# Patient Record
Sex: Female | Born: 1955
Health system: Southern US, Community
[De-identification: ages and names within clinical notes are randomized; demographics above are authoritative.]

## PROBLEM LIST (undated history)

## (undated) DIAGNOSIS — T7840XA Allergy, unspecified, initial encounter: Secondary | ICD-10-CM

## (undated) DIAGNOSIS — K635 Polyp of colon: Secondary | ICD-10-CM

## (undated) DIAGNOSIS — I10 Essential (primary) hypertension: Secondary | ICD-10-CM

## (undated) DIAGNOSIS — H269 Unspecified cataract: Secondary | ICD-10-CM

## (undated) HISTORY — PX: URETHRAL CYST REMOVAL: SHX5128

## (undated) HISTORY — DX: Essential (primary) hypertension: I10

## (undated) HISTORY — PX: COLONOSCOPY W/ POLYPECTOMY: SHX1380

## (undated) HISTORY — PX: WISDOM TOOTH EXTRACTION: SHX21

## (undated) HISTORY — DX: Unspecified cataract: H26.9

## (undated) HISTORY — DX: Allergy, unspecified, initial encounter: T78.40XA

## (undated) HISTORY — PX: TONSILLECTOMY: SUR1361

## (undated) HISTORY — DX: Polyp of colon: K63.5

---

## 1979-11-08 HISTORY — PX: TUBAL LIGATION: SHX77

## 1998-01-23 ENCOUNTER — Ambulatory Visit (HOSPITAL_COMMUNITY): Admission: RE | Admit: 1998-01-23 | Discharge: 1998-01-23 | Payer: Self-pay | Admitting: Emergency Medicine

## 1999-01-06 ENCOUNTER — Other Ambulatory Visit: Admission: RE | Admit: 1999-01-06 | Discharge: 1999-01-06 | Payer: Self-pay | Admitting: Emergency Medicine

## 1999-02-11 ENCOUNTER — Encounter: Payer: Self-pay | Admitting: Emergency Medicine

## 1999-02-11 ENCOUNTER — Ambulatory Visit (HOSPITAL_COMMUNITY): Admission: RE | Admit: 1999-02-11 | Discharge: 1999-02-11 | Payer: Self-pay | Admitting: Emergency Medicine

## 2000-01-21 ENCOUNTER — Other Ambulatory Visit: Admission: RE | Admit: 2000-01-21 | Discharge: 2000-01-21 | Payer: Self-pay | Admitting: Emergency Medicine

## 2000-01-28 ENCOUNTER — Encounter: Admission: RE | Admit: 2000-01-28 | Discharge: 2000-04-27 | Payer: Self-pay | Admitting: Emergency Medicine

## 2000-02-15 ENCOUNTER — Ambulatory Visit (HOSPITAL_COMMUNITY): Admission: RE | Admit: 2000-02-15 | Discharge: 2000-02-15 | Payer: Self-pay | Admitting: *Deleted

## 2000-08-09 ENCOUNTER — Encounter: Admission: RE | Admit: 2000-08-09 | Discharge: 2000-11-07 | Payer: Self-pay | Admitting: Emergency Medicine

## 2001-03-29 ENCOUNTER — Encounter: Payer: Self-pay | Admitting: Emergency Medicine

## 2001-03-29 ENCOUNTER — Ambulatory Visit (HOSPITAL_COMMUNITY): Admission: RE | Admit: 2001-03-29 | Discharge: 2001-03-29 | Payer: Self-pay | Admitting: Emergency Medicine

## 2001-04-04 ENCOUNTER — Encounter: Payer: Self-pay | Admitting: Emergency Medicine

## 2001-04-04 ENCOUNTER — Encounter: Admission: RE | Admit: 2001-04-04 | Discharge: 2001-04-04 | Payer: Self-pay | Admitting: Emergency Medicine

## 2001-04-19 ENCOUNTER — Other Ambulatory Visit: Admission: RE | Admit: 2001-04-19 | Discharge: 2001-04-19 | Payer: Self-pay | Admitting: Emergency Medicine

## 2002-04-09 ENCOUNTER — Encounter: Payer: Self-pay | Admitting: Emergency Medicine

## 2002-04-09 ENCOUNTER — Encounter: Admission: RE | Admit: 2002-04-09 | Discharge: 2002-04-09 | Payer: Self-pay | Admitting: Emergency Medicine

## 2002-04-17 ENCOUNTER — Ambulatory Visit (HOSPITAL_COMMUNITY): Admission: RE | Admit: 2002-04-17 | Discharge: 2002-04-17 | Payer: Self-pay | Admitting: Emergency Medicine

## 2002-04-17 ENCOUNTER — Encounter: Payer: Self-pay | Admitting: Emergency Medicine

## 2002-04-30 ENCOUNTER — Encounter: Admission: RE | Admit: 2002-04-30 | Discharge: 2002-04-30 | Payer: Self-pay | Admitting: Emergency Medicine

## 2002-04-30 ENCOUNTER — Encounter: Payer: Self-pay | Admitting: Emergency Medicine

## 2002-09-24 ENCOUNTER — Encounter: Admission: RE | Admit: 2002-09-24 | Discharge: 2002-09-24 | Payer: Self-pay | Admitting: Emergency Medicine

## 2002-09-24 ENCOUNTER — Encounter: Payer: Self-pay | Admitting: Emergency Medicine

## 2002-11-10 ENCOUNTER — Ambulatory Visit (HOSPITAL_COMMUNITY): Admission: RE | Admit: 2002-11-10 | Discharge: 2002-11-10 | Payer: Self-pay | Admitting: Emergency Medicine

## 2002-11-10 ENCOUNTER — Encounter: Payer: Self-pay | Admitting: Emergency Medicine

## 2003-05-08 ENCOUNTER — Ambulatory Visit (HOSPITAL_COMMUNITY): Admission: RE | Admit: 2003-05-08 | Discharge: 2003-05-08 | Payer: Self-pay | Admitting: Emergency Medicine

## 2003-05-08 ENCOUNTER — Encounter: Payer: Self-pay | Admitting: Emergency Medicine

## 2003-08-21 ENCOUNTER — Encounter: Admission: RE | Admit: 2003-08-21 | Discharge: 2003-11-19 | Payer: Self-pay | Admitting: Emergency Medicine

## 2004-06-04 ENCOUNTER — Ambulatory Visit (HOSPITAL_COMMUNITY): Admission: RE | Admit: 2004-06-04 | Discharge: 2004-06-04 | Payer: Self-pay | Admitting: Emergency Medicine

## 2005-05-31 ENCOUNTER — Ambulatory Visit: Payer: Self-pay | Admitting: Internal Medicine

## 2005-06-07 ENCOUNTER — Ambulatory Visit (HOSPITAL_COMMUNITY): Admission: RE | Admit: 2005-06-07 | Discharge: 2005-06-07 | Payer: Self-pay | Admitting: Emergency Medicine

## 2005-07-19 ENCOUNTER — Encounter: Admission: RE | Admit: 2005-07-19 | Discharge: 2005-07-19 | Payer: Self-pay | Admitting: Emergency Medicine

## 2005-10-14 ENCOUNTER — Ambulatory Visit: Payer: Self-pay | Admitting: Gastroenterology

## 2005-10-25 ENCOUNTER — Ambulatory Visit: Payer: Self-pay | Admitting: Gastroenterology

## 2005-10-25 ENCOUNTER — Encounter (INDEPENDENT_AMBULATORY_CARE_PROVIDER_SITE_OTHER): Payer: Self-pay | Admitting: *Deleted

## 2006-07-06 ENCOUNTER — Ambulatory Visit (HOSPITAL_COMMUNITY): Admission: RE | Admit: 2006-07-06 | Discharge: 2006-07-06 | Payer: Self-pay | Admitting: Emergency Medicine

## 2007-07-10 ENCOUNTER — Ambulatory Visit (HOSPITAL_COMMUNITY): Admission: RE | Admit: 2007-07-10 | Discharge: 2007-07-10 | Payer: Self-pay | Admitting: Emergency Medicine

## 2008-07-11 ENCOUNTER — Ambulatory Visit (HOSPITAL_COMMUNITY): Admission: RE | Admit: 2008-07-11 | Discharge: 2008-07-11 | Payer: Self-pay | Admitting: Emergency Medicine

## 2008-07-31 ENCOUNTER — Ambulatory Visit: Payer: Self-pay | Admitting: Gastroenterology

## 2008-08-15 ENCOUNTER — Encounter: Payer: Self-pay | Admitting: Gastroenterology

## 2008-08-15 ENCOUNTER — Ambulatory Visit: Payer: Self-pay | Admitting: Gastroenterology

## 2008-08-19 ENCOUNTER — Encounter: Payer: Self-pay | Admitting: Gastroenterology

## 2009-01-14 ENCOUNTER — Encounter (INDEPENDENT_AMBULATORY_CARE_PROVIDER_SITE_OTHER): Payer: Self-pay | Admitting: *Deleted

## 2009-07-17 ENCOUNTER — Ambulatory Visit (HOSPITAL_COMMUNITY): Admission: RE | Admit: 2009-07-17 | Discharge: 2009-07-17 | Payer: Self-pay | Admitting: Family Medicine

## 2009-10-05 ENCOUNTER — Encounter (INDEPENDENT_AMBULATORY_CARE_PROVIDER_SITE_OTHER): Payer: Self-pay | Admitting: *Deleted

## 2009-10-06 ENCOUNTER — Ambulatory Visit: Payer: Self-pay | Admitting: Gastroenterology

## 2009-10-16 ENCOUNTER — Ambulatory Visit: Payer: Self-pay | Admitting: Gastroenterology

## 2009-10-21 ENCOUNTER — Encounter: Payer: Self-pay | Admitting: Gastroenterology

## 2010-06-16 ENCOUNTER — Other Ambulatory Visit: Admission: RE | Admit: 2010-06-16 | Discharge: 2010-06-16 | Payer: Self-pay | Admitting: Family Medicine

## 2010-08-06 ENCOUNTER — Ambulatory Visit (HOSPITAL_COMMUNITY): Admission: RE | Admit: 2010-08-06 | Discharge: 2010-08-06 | Payer: Self-pay | Admitting: Family Medicine

## 2010-08-18 ENCOUNTER — Encounter: Admission: RE | Admit: 2010-08-18 | Discharge: 2010-08-18 | Payer: Self-pay | Admitting: Family Medicine

## 2010-11-28 ENCOUNTER — Encounter: Payer: Self-pay | Admitting: Emergency Medicine

## 2011-07-21 ENCOUNTER — Other Ambulatory Visit (HOSPITAL_COMMUNITY): Payer: Self-pay | Admitting: Family Medicine

## 2011-07-21 DIAGNOSIS — Z1231 Encounter for screening mammogram for malignant neoplasm of breast: Secondary | ICD-10-CM

## 2011-08-02 ENCOUNTER — Ambulatory Visit (HOSPITAL_COMMUNITY): Payer: 59 | Attending: Family Medicine

## 2011-08-19 ENCOUNTER — Telehealth: Payer: Self-pay | Admitting: Gastroenterology

## 2011-08-22 NOTE — Telephone Encounter (Signed)
Pt is due for colon 10/2011. Left message on machine to call back

## 2011-08-22 NOTE — Telephone Encounter (Signed)
Pt will be scheduled a colon by [facility] and previsit

## 2011-09-01 ENCOUNTER — Ambulatory Visit (HOSPITAL_COMMUNITY)
Admission: RE | Admit: 2011-09-01 | Discharge: 2011-09-01 | Disposition: A | Discharge status: Home / Self Care | Payer: 59 | Source: Ambulatory Visit | Attending: Family Medicine | Admitting: Family Medicine

## 2011-09-01 DIAGNOSIS — Z1231 Encounter for screening mammogram for malignant neoplasm of breast: Secondary | ICD-10-CM | POA: Insufficient documentation

## 2011-10-11 ENCOUNTER — Telehealth: Payer: Self-pay | Admitting: Gastroenterology

## 2011-10-13 NOTE — Telephone Encounter (Signed)
Pt wants to call back after christmas and schedule she is not sure when she will be able to have this done just now.

## 2011-10-13 NOTE — Telephone Encounter (Signed)
Left message on machine to call back regarding colon (can be on 11/17/11 possibly if she can do this date)

## 2011-11-15 ENCOUNTER — Telehealth: Payer: Self-pay | Admitting: Gastroenterology

## 2011-11-16 ENCOUNTER — Other Ambulatory Visit: Payer: Self-pay

## 2011-11-16 NOTE — Telephone Encounter (Signed)
Pt has been scheduled for a pre visit and colon at [facility] pre visit letter mailed

## 2011-12-15 ENCOUNTER — Ambulatory Visit (AMBULATORY_SURGERY_CENTER): Payer: 59 | Admitting: *Deleted

## 2011-12-15 VITALS — Ht 63.0 in | Wt 163.0 lb

## 2011-12-15 DIAGNOSIS — Z1211 Encounter for screening for malignant neoplasm of colon: Secondary | ICD-10-CM

## 2011-12-15 MED ORDER — PEG-KCL-NACL-NASULF-NA ASC-C 100 G PO SOLR: ORAL | Status: DC

## 2011-12-16 ENCOUNTER — Telehealth: Payer: Self-pay | Admitting: Gastroenterology

## 2011-12-16 NOTE — Telephone Encounter (Signed)
Left message on machine to call back  

## 2011-12-16 NOTE — Telephone Encounter (Signed)
Pt had dietary questions about what to take and not take prior to procedure

## 2011-12-20 ENCOUNTER — Other Ambulatory Visit (HOSPITAL_COMMUNITY): Payer: 59

## 2011-12-21 ENCOUNTER — Ambulatory Visit (HOSPITAL_COMMUNITY)
Admission: RE | Admit: 2011-12-21 | Discharge: 2011-12-21 | Disposition: A | Discharge status: Home / Self Care | Payer: 59 | Source: Ambulatory Visit | Attending: Gastroenterology | Admitting: Gastroenterology

## 2011-12-21 ENCOUNTER — Encounter (HOSPITAL_COMMUNITY): Payer: Self-pay

## 2011-12-21 NOTE — Anesthesia Preprocedure Evaluation (Signed)
Anesthesia Evaluation  Patient identified by MRN, date of birth, ID band Patient awake    Reviewed: Allergy & Precautions, H&P , NPO status , Patient's Chart, lab work & pertinent test results, reviewed documented beta blocker date and time   Airway Mallampati: II TM Distance: >3 FB     Dental  (+) Teeth Intact   Pulmonary asthma ,  Daily use of inhaler allergies clear to auscultation        Cardiovascular hypertension, Pt. on medications Regular Normal Denies cardiac symptoms   Neuro/Psych Negative Neurological ROS  Negative Psych ROS   GI/Hepatic Neg liver ROS, Hx of polyps   Endo/Other  Diabetes mellitus-Diet controlled  Renal/GU negative Renal ROS  Genitourinary negative   Musculoskeletal negative musculoskeletal ROS (+)   Abdominal   Peds negative pediatric ROS (+)  Hematology negative hematology ROS (+)   Anesthesia Other Findings   Reproductive/Obstetrics negative OB ROS                           Anesthesia Physical Anesthesia Plan  ASA: II  Anesthesia Plan: MAC   Post-op Pain Management:    Induction: Intravenous  Airway Management Planned: Mask  Additional Equipment:   Intra-op Plan:   Post-operative Plan:   Informed Consent: I have reviewed the patients History and Physical, chart, labs and discussed the procedure including the risks, benefits and alternatives for the proposed anesthesia with the patient or authorized representative who has indicated his/her understanding and acceptance.     Plan Discussed with: CRNA and Surgeon  Anesthesia Plan Comments:         Anesthesia Quick Evaluation

## 2011-12-22 ENCOUNTER — Encounter (HOSPITAL_COMMUNITY)
Admission: RE | Disposition: A | Discharge status: Home / Self Care | Payer: Self-pay | Source: Ambulatory Visit | Attending: Gastroenterology

## 2011-12-22 ENCOUNTER — Encounter (HOSPITAL_COMMUNITY): Payer: Self-pay | Admitting: Anesthesiology

## 2011-12-22 ENCOUNTER — Other Ambulatory Visit: Payer: Self-pay | Admitting: Gastroenterology

## 2011-12-22 ENCOUNTER — Encounter (HOSPITAL_COMMUNITY): Payer: Self-pay | Admitting: *Deleted

## 2011-12-22 ENCOUNTER — Ambulatory Visit (HOSPITAL_COMMUNITY)
Admission: RE | Admit: 2011-12-22 | Discharge: 2011-12-22 | Disposition: A | Discharge status: Home / Self Care | Payer: 59 | Source: Ambulatory Visit | Attending: Gastroenterology | Admitting: Gastroenterology

## 2011-12-22 ENCOUNTER — Ambulatory Visit (HOSPITAL_COMMUNITY): Payer: 59 | Admitting: Anesthesiology

## 2011-12-22 DIAGNOSIS — E119 Type 2 diabetes mellitus without complications: Secondary | ICD-10-CM | POA: Insufficient documentation

## 2011-12-22 DIAGNOSIS — Z860100 Personal history of colon polyps, unspecified: Secondary | ICD-10-CM

## 2011-12-22 DIAGNOSIS — D126 Benign neoplasm of colon, unspecified: Secondary | ICD-10-CM

## 2011-12-22 DIAGNOSIS — K5730 Diverticulosis of large intestine without perforation or abscess without bleeding: Secondary | ICD-10-CM

## 2011-12-22 DIAGNOSIS — H409 Unspecified glaucoma: Secondary | ICD-10-CM | POA: Insufficient documentation

## 2011-12-22 DIAGNOSIS — I10 Essential (primary) hypertension: Secondary | ICD-10-CM | POA: Insufficient documentation

## 2011-12-22 HISTORY — PX: COLONOSCOPY: SHX5424

## 2011-12-22 HISTORY — PX: HOT HEMOSTASIS: SHX5433

## 2011-12-22 SURGERY — COLONOSCOPY
Anesthesia: Monitor Anesthesia Care

## 2011-12-22 MED ORDER — SODIUM CHLORIDE 0.9 % IV SOLN
INTRAVENOUS | Status: DC | PRN
  Administered 2011-12-22: 08:00:00 via INTRAVENOUS

## 2011-12-22 MED ORDER — MIDAZOLAM HCL 5 MG/5ML IJ SOLN
INTRAMUSCULAR | Status: DC | PRN
  Administered 2011-12-22: 1 mg via INTRAVENOUS

## 2011-12-22 MED ORDER — LIDOCAINE HCL (CARDIAC) 20 MG/ML IV SOLN
INTRAVENOUS | Status: DC | PRN
  Administered 2011-12-22 (×2): 25 mg via INTRAVENOUS

## 2011-12-22 MED ORDER — PROPOFOL 10 MG/ML IV EMUL
INTRAVENOUS | Status: DC | PRN
  Administered 2011-12-22: 140 ug/kg/min via INTRAVENOUS

## 2011-12-22 MED ORDER — FENTANYL CITRATE 0.05 MG/ML IJ SOLN
INTRAMUSCULAR | Status: DC | PRN
  Administered 2011-12-22 (×2): 50 ug via INTRAVENOUS

## 2011-12-22 MED ORDER — SPOT INK MARKER SYRINGE KIT
PACK | SUBMUCOSAL | Status: DC | PRN
  Administered 2011-12-22: 2 mL via SUBMUCOSAL

## 2011-12-22 MED ORDER — SPOT INK MARKER SYRINGE KIT
PACK | SUBMUCOSAL | Status: AC
  Filled 2011-12-22: qty 5

## 2011-12-22 MED ORDER — KETAMINE HCL 10 MG/ML IJ SOLN
INTRAMUSCULAR | Status: DC | PRN
  Administered 2011-12-22 (×2): 10 mg via INTRAVENOUS

## 2011-12-22 NOTE — Transfer of Care (Signed)
Immediate Anesthesia Transfer of Care Note  Patient: Kristina Mckinney  Procedure(s) Performed: Procedure(s) (LRB): COLONOSCOPY (N/A) HOT HEMOSTASIS (ARGON PLASMA COAGULATION/BICAP) (N/A)  Patient Location: PACU  Anesthesia Type: MAC  Level of Consciousness: awake, alert  and oriented  Airway & Oxygen Therapy: Patient connected to face mask oxygen  Post-op Assessment: Report given to PACU RN and Post -op Vital signs reviewed and stable  Post vital signs: Reviewed and stable  Complications: No apparent anesthesia complications

## 2011-12-22 NOTE — Anesthesia Postprocedure Evaluation (Signed)
  Anesthesia Post-op Note  Patient: Kristina Mckinney  Procedure(s) Performed: Procedure(s) (LRB): COLONOSCOPY (N/A) HOT HEMOSTASIS (ARGON PLASMA COAGULATION/BICAP) (N/A)  Patient Location: PACU  Anesthesia Type: MAC  Level of Consciousness: awake and alert   Airway and Oxygen Therapy: Patient Spontanous Breathing  Post-op Pain: mild  Post-op Assessment: Post-op Vital signs reviewed, Patient's Cardiovascular Status Stable, Respiratory Function Stable, Patent Airway and No signs of Nausea or vomiting  Post-op Vital Signs: stable  Complications: No apparent anesthesia complications

## 2011-12-22 NOTE — Preoperative (Signed)
Beta Blockers   Reason not to administer Beta Blockers:Not Applicable 

## 2011-12-22 NOTE — Op Note (Signed)
[facility] [address] [place], [place]  [address]  COLONOSCOPY PROCEDURE REPORT  PATIENT:  Kristina, Mckinney  MR#:  [id number] BIRTHDATE:  [birth year], 55 yrs. old  GENDER:  female ENDOSCOPIST:  Rachael Fee, MD REF. 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  Digital rectal exam was performed and revealed no rectal masses.   The EC-[id number] ([id number]) endoscope was introduced through the anus and advanced to the cecum, which was identified by both the appendix and ileocecal valve, without limitations.  The quality of the prep was good..  The instrument was then slowly withdrawn as the colon was fully examined. <<PROCEDUREIMAGES>>  FINDINGS:  There was a heaped up, villous appearing polyp, 1.6cm across, located at hepatic flexure. This was removed with snare/cautery in piecemeal fashion and sent to path (pathology jar 1). APC was then applied to the site and then the site was labled with submucosal injection of [place] Ink.  Mild diverticulosis was found throughout the colon (see image003).  This was otherwise a normal examination of the colon (see image005, image004, and image009).   Retroflexed views in the rectum revealed no abnormalities. COMPLICATIONS:  None  ENDOSCOPIC IMPRESSION: 1) Recurrent polyp at hepatic flexure.  Removed with piecemeal resection, APC treatment and then [place] Ink injected to aid in future localization 2) Mild diverticulosis throughout the colon 3) Otherwise normal examination  RECOMMENDATIONS: 1) You will receive a  letter within 1-2 weeks with the results of your biopsy as well as final recommendations. Please call my office if you have not received a letter after 3 weeks.  Likely you will need repeat colonoscopy at [facility] with MAC in 1 year. n. eSIGNED:   Rachael Fee at 12/22/2011 09:46 AM  Kyra Manges, [phone]

## 2011-12-22 NOTE — H&P (Signed)
  HPI: This is a woman with TVA at hepatic flexure, needs colonsocopy    Past Medical History  Diagnosis Date  . Allergy   . Asthma   . Diabetes mellitus     diet controlled  . Glaucoma   . Hypertension     Past Surgical History  Procedure Date  . Tubal ligation 1981  . Urethral cyst removal   . Tonsillectomy   . Colonoscopy w/ polypectomy     2010    No current facility-administered medications for this encounter.    Allergies as of 11/16/2011 - reviewed 07/31/2008  Allergen Reaction Noted  . Penicillins  07/31/2008    History reviewed. No pertinent family history.  History   Social History  . Marital Status: Married    Spouse Name: N/A    Number of Children: N/A  . Years of Education: N/A   Occupational History  . Not on file.   Social History Main Topics  . Smoking status: Former Games developer  . Smokeless tobacco: Never Used  . Alcohol Use: No  . Drug Use: No  . Sexually Active: Not on file   Other Topics Concern  . Not on file   Social History Narrative  . No narrative on file      Physical Exam: BP 113/70  Pulse 70  Temp(Src) 98.2 F (36.8 C) (Oral)  Resp 22  SpO2 99% Constitutional: generally well-appearing Psychiatric: alert and oriented x3 Abdomen: soft, nontender, nondistended, no obvious ascites, no peritoneal signs, normal bowel sounds     Assessment and plan: 56 y.o. female with history of TVA  colonsocopy today

## 2011-12-26 ENCOUNTER — Encounter (HOSPITAL_COMMUNITY): Payer: Self-pay | Admitting: Gastroenterology

## 2011-12-27 ENCOUNTER — Encounter: Payer: Self-pay | Admitting: Gastroenterology

## 2012-07-11 ENCOUNTER — Other Ambulatory Visit: Payer: Self-pay | Admitting: Family Medicine

## 2012-07-11 ENCOUNTER — Other Ambulatory Visit (HOSPITAL_COMMUNITY)
Admission: RE | Admit: 2012-07-11 | Discharge: 2012-07-11 | Disposition: A | Discharge status: Home / Self Care | Payer: 59 | Source: Ambulatory Visit | Attending: Family Medicine | Admitting: Family Medicine

## 2012-07-11 DIAGNOSIS — Z124 Encounter for screening for malignant neoplasm of cervix: Secondary | ICD-10-CM | POA: Insufficient documentation

## 2012-08-10 ENCOUNTER — Other Ambulatory Visit (HOSPITAL_COMMUNITY): Payer: Self-pay | Admitting: Family Medicine

## 2012-08-10 DIAGNOSIS — Z1231 Encounter for screening mammogram for malignant neoplasm of breast: Secondary | ICD-10-CM

## 2012-09-03 ENCOUNTER — Ambulatory Visit (HOSPITAL_COMMUNITY)
Admission: RE | Admit: 2012-09-03 | Discharge: 2012-09-03 | Disposition: A | Discharge status: Home / Self Care | Payer: 59 | Source: Ambulatory Visit | Attending: Family Medicine | Admitting: Family Medicine

## 2012-09-03 DIAGNOSIS — Z1231 Encounter for screening mammogram for malignant neoplasm of breast: Secondary | ICD-10-CM | POA: Insufficient documentation

## 2012-11-16 ENCOUNTER — Ambulatory Visit: Payer: 59 | Admitting: Family Medicine

## 2012-12-06 ENCOUNTER — Telehealth: Payer: Self-pay

## 2012-12-06 ENCOUNTER — Other Ambulatory Visit: Payer: Self-pay

## 2012-12-06 DIAGNOSIS — Z86010 Personal history of colonic polyps: Secondary | ICD-10-CM

## 2012-12-06 NOTE — Telephone Encounter (Signed)
Left message on machine to call back  

## 2012-12-06 NOTE — Telephone Encounter (Signed)
Prep needs to be sent to the pharmacy as well as the referral Left message on machine to call back

## 2012-12-06 NOTE — Telephone Encounter (Signed)
I spoke with the pt and she wants to think about the date and see if she will have a ride and call back if the date will work.

## 2012-12-10 NOTE — Telephone Encounter (Signed)
Pt has been scheduled for 01/03/13 and a previsit has been scheduled for 12/27/12

## 2012-12-26 ENCOUNTER — Encounter (HOSPITAL_COMMUNITY): Payer: Self-pay | Admitting: Pharmacy Technician

## 2012-12-26 ENCOUNTER — Encounter (HOSPITAL_COMMUNITY)
Admission: RE | Admit: 2012-12-26 | Discharge: 2012-12-26 | Disposition: A | Discharge status: Home / Self Care | Payer: 59 | Source: Ambulatory Visit | Attending: Gastroenterology | Admitting: Gastroenterology

## 2012-12-26 ENCOUNTER — Encounter (HOSPITAL_COMMUNITY): Payer: Self-pay | Admitting: *Deleted

## 2012-12-26 LAB — BASIC METABOLIC PANEL
BUN: 11 mg/dL (ref 6–23)
CO2: 27 mEq/L (ref 19–32)
Chloride: 103 mEq/L (ref 96–112)
Creatinine, Ser: 0.53 mg/dL (ref 0.50–1.10)
GFR calc Af Amer: >90 mL/min (ref >90)
Potassium: 3.9 mEq/L (ref 3.5–5.1)

## 2012-12-26 NOTE — Pre-Procedure Instructions (Addendum)
Your procedure is scheduled [id number], January 03, 2013 Report to [facility] Admitting [id number] Call this number if you have problems morning of your procedure:[phone]  Follow all bowel prep instructions per your doctor's orders.  Do not eat or drink anything after midnight the night before your procedure. You may brush your teeth, rinse out your mouth, but no water, no food, no chewing gum, no mints, no candies, no chewing tobacco.     Take these medicines the morning of your procedure with A SIP OF WATER:May use Advair inhaler,Omnais nasal spray   Please make arrangements for a responsible person to drive you home after the procedure. You cannot go home by cab/taxi. We recommend you have someone with you at home the first 24 hours after your procedure. Driver for procedure is mother Elson Clan  [phone]  LEAVE ALL VALUABLES, JEWELRY, BILLFOLD AT HOME.  NO DENTURES, CONTACT LENSES ALLOWED IN THE ENDOSCOPY ROOM.   YOU MAY WEAR DEODORANT, PLEASE REMOVE ALL JEWELRY, WATCHES RINGS, BODY PIERCINGS AND LEAVE AT HOME.   WOMEN: NO MAKE-UP, LOTIONS PERFUMES

## 2012-12-27 ENCOUNTER — Ambulatory Visit (AMBULATORY_SURGERY_CENTER): Payer: 59 | Admitting: *Deleted

## 2012-12-27 VITALS — Ht 63.0 in | Wt 160.0 lb

## 2012-12-27 DIAGNOSIS — Z86010 Personal history of colonic polyps: Secondary | ICD-10-CM

## 2012-12-27 DIAGNOSIS — Z1211 Encounter for screening for malignant neoplasm of colon: Secondary | ICD-10-CM

## 2012-12-27 NOTE — Progress Notes (Signed)
Suprep sample given to pt and Moviprep RX discontinued

## 2013-01-03 ENCOUNTER — Ambulatory Visit (HOSPITAL_COMMUNITY): Payer: 59 | Admitting: Anesthesiology

## 2013-01-03 ENCOUNTER — Encounter (HOSPITAL_COMMUNITY)
Admission: RE | Disposition: A | Discharge status: Home / Self Care | Payer: Self-pay | Source: Ambulatory Visit | Attending: Gastroenterology

## 2013-01-03 ENCOUNTER — Encounter (HOSPITAL_COMMUNITY): Payer: Self-pay | Admitting: *Deleted

## 2013-01-03 ENCOUNTER — Encounter (HOSPITAL_COMMUNITY): Payer: Self-pay | Admitting: Anesthesiology

## 2013-01-03 ENCOUNTER — Ambulatory Visit (HOSPITAL_COMMUNITY)
Admission: RE | Admit: 2013-01-03 | Discharge: 2013-01-03 | Disposition: A | Discharge status: Home / Self Care | Payer: 59 | Source: Ambulatory Visit | Attending: Gastroenterology | Admitting: Gastroenterology

## 2013-01-03 DIAGNOSIS — E119 Type 2 diabetes mellitus without complications: Secondary | ICD-10-CM | POA: Insufficient documentation

## 2013-01-03 DIAGNOSIS — K5730 Diverticulosis of large intestine without perforation or abscess without bleeding: Secondary | ICD-10-CM | POA: Insufficient documentation

## 2013-01-03 DIAGNOSIS — D126 Benign neoplasm of colon, unspecified: Secondary | ICD-10-CM | POA: Insufficient documentation

## 2013-01-03 DIAGNOSIS — Z86010 Personal history of colonic polyps: Secondary | ICD-10-CM

## 2013-01-03 DIAGNOSIS — I10 Essential (primary) hypertension: Secondary | ICD-10-CM | POA: Insufficient documentation

## 2013-01-03 DIAGNOSIS — H409 Unspecified glaucoma: Secondary | ICD-10-CM | POA: Insufficient documentation

## 2013-01-03 HISTORY — PX: COLONOSCOPY WITH PROPOFOL: SHX5780

## 2013-01-03 LAB — GLUCOSE, CAPILLARY: Glucose-Capillary: 79 mg/dL (ref 70–99)

## 2013-01-03 SURGERY — COLONOSCOPY WITH PROPOFOL
Anesthesia: Monitor Anesthesia Care

## 2013-01-03 MED ORDER — SODIUM CHLORIDE 0.9 % IV SOLN: INTRAVENOUS | Status: DC

## 2013-01-03 MED ORDER — LACTATED RINGERS IV SOLN
INTRAVENOUS | Status: DC | PRN
  Administered 2013-01-03: 1000 mL via INTRAVENOUS
  Administered 2013-01-03: 07:00:00 via INTRAVENOUS

## 2013-01-03 MED ORDER — FENTANYL CITRATE 0.05 MG/ML IJ SOLN
INTRAMUSCULAR | Status: DC | PRN
  Administered 2013-01-03 (×2): 50 ug via INTRAVENOUS

## 2013-01-03 MED ORDER — PROPOFOL INFUSION 10 MG/ML OPTIME
INTRAVENOUS | Status: DC | PRN
  Administered 2013-01-03: 140 ug/kg/min via INTRAVENOUS

## 2013-01-03 MED ORDER — MIDAZOLAM HCL 5 MG/5ML IJ SOLN
INTRAMUSCULAR | Status: DC | PRN
  Administered 2013-01-03 (×2): 1 mg via INTRAVENOUS

## 2013-01-03 SURGICAL SUPPLY — 21 items

## 2013-01-03 NOTE — Op Note (Signed)
[facility] [address] [place], [address]   COLONOSCOPY PROCEDURE REPORT  PATIENT: [name], [name]  MR#: [id number] BIRTHDATE: 08/14/1956 , 56  yrs. 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 MEDICATIONS: MAC sedation, administered by CRNA  DESCRIPTION OF PROCEDURE:   After the risks benefits and alternatives of the procedure were thoroughly explained, informed consent was obtained.  A digital rectal exam revealed no abnormalities of the rectum.   The Pentax Colonoscope [id number] endoscope was introduced through the anus and advanced to the cecum, which was identified by both the appendix and ileocecal valve. No adverse events experienced.   The quality of the prep was good.  The instrument was then slowly withdrawn as the colon was fully examined.  COLON FINDINGS: There were several diverticulum scattered throughout the colon.  The site of previous hepatic flexure polypectomy was clearly identified with [place] Ink tatoo.  There was no residual or recurrent polyp within the Ink margins however about 1cm distal, there was a 5mm sessile polyp (at hepatic fexure) which I removed with snare, cautery and sent to pathology.  The examination was otherwise normal.  Retroflexed views revealed no abnormalities. The time to cecum=5 minutes 00 seconds.  Withdrawal time=10 minutes 00 seconds.  The scope was withdrawn and the procedure completed. COMPLICATIONS: There were no complications.  ENDOSCOPIC IMPRESSION: There were several diverticulum scattered throughout the colon.  The site of previous hepatic flexure polypectomy was clearly identified with [place] Ink tatoo.  There was no residual or recurrent polyp within  the Ink margins however about 1cm distal, there was a 5mm sessile polyp (at hepatic fexure) which I removed with snare, cautery and sent to pathology.  The examination was otherwise normal.  RECOMMENDATIONS: Await final pathology.  Likely I will recommend you need repeat colonoscopy in 3 years.  You will receive a letter within 1-2 weeks with the results of your biopsy as well as final recommendations. Please call my office if you have not received a letter after 3 weeks.   eSigned:  Rachael Fee, MD 01/03/2013 8:00 AM   cc: Lupe Carney, MD   PATIENT NAME:  [name], [name] MR#: [id number]

## 2013-01-03 NOTE — Anesthesia Preprocedure Evaluation (Signed)
Anesthesia Evaluation  Patient identified by MRN, date of birth, ID band Patient awake    Reviewed: Allergy & Precautions, H&P , NPO status , Patient's Chart, lab work & pertinent test results, reviewed documented beta blocker date and time   Airway Mallampati: II TM Distance: >3 FB Neck ROM: full    Dental no notable dental hx.    Pulmonary asthma ,  breath sounds clear to auscultation  Pulmonary exam normal       Cardiovascular Exercise Tolerance: Good hypertension, On Medications Rhythm:regular Rate:Normal     Neuro/Psych negative neurological ROS  negative psych ROS   GI/Hepatic negative GI ROS, Neg liver ROS,   Endo/Other  negative endocrine ROSdiabetes, Type 2  Renal/GU negative Renal ROS  negative genitourinary   Musculoskeletal   Abdominal   Peds  Hematology negative hematology ROS (+)   Anesthesia Other Findings   Reproductive/Obstetrics negative OB ROS                           Anesthesia Physical Anesthesia Plan  ASA: III  Anesthesia Plan: MAC   Post-op Pain Management:    Induction: Intravenous  Airway Management Planned:   Additional Equipment:   Intra-op Plan:   Post-operative Plan:   Informed Consent: I have reviewed the patients History and Physical, chart, labs and discussed the procedure including the risks, benefits and alternatives for the proposed anesthesia with the patient or authorized representative who has indicated his/her understanding and acceptance.   Dental Advisory Given  Plan Discussed with: CRNA  Anesthesia Plan Comments:         Anesthesia Quick Evaluation

## 2013-01-03 NOTE — H&P (Signed)
  HPI: This is a woman with recurrent polyp at hepatic flexure, last treated one year ago with snare removal, apc to site    Past Medical History  Diagnosis Date  . Allergy   . Asthma   . Diabetes mellitus     diet controlled  . Glaucoma   . Hypertension     Past Surgical History  Procedure Laterality Date  . Tubal ligation  1981  . Urethral cyst removal    . Tonsillectomy    . Colonoscopy w/ polypectomy      2010  . Colonoscopy  12/22/2011    Procedure: COLONOSCOPY;  Surgeon: Rob Bunting, MD;  Location: [facility] ENDOSCOPY;  Service: Endoscopy;  Laterality: N/A;  needs APC/MAC  . Hot hemostasis  12/22/2011    Procedure: HOT HEMOSTASIS (ARGON PLASMA COAGULATION/BICAP);  Surgeon: Rob Bunting, MD;  Location: [facility];  Service: Endoscopy;  Laterality: N/A;    Current Facility-Administered Medications  Medication Dose Route Frequency Provider Last Rate Last Dose  . 0.9 %  sodium chloride infusion   Intravenous Continuous Rachael Fee, MD       Facility-Administered Medications Ordered in Other Encounters  Medication Dose Route Frequency Provider Last Rate Last Dose  . lactated ringers infusion    Continuous PRN Delphia Grates, CRNA        Allergies as of 12/06/2012 - Review Complete 12/22/2011  Allergen Reaction Noted  . Penicillins  07/31/2008  . Tetracyclines & related Rash 12/15/2011    Family History  Problem Relation Age of Onset  . Colon cancer Neg Hx   . Esophageal cancer Neg Hx   . Rectal cancer Neg Hx   . Stomach cancer Neg Hx     History   Social History  . Marital Status: Married    Spouse Name: N/A    Number of Children: N/A  . Years of Education: N/A   Occupational History  . Not on file.   Social History Main Topics  . Smoking status: Former Smoker    Quit date: 01/04/1980  . Smokeless tobacco: Never Used  . Alcohol Use: No  . Drug Use: No  . Sexually Active: Not on file   Other Topics Concern  . Not on file   Social History  Narrative  . No narrative on file      Physical Exam: BP 127/69  Pulse 66  Temp(Src) 98.1 F (36.7 C) (Oral)  Resp 18  Ht 5\' 3"  (1.6 m)  Wt 162 lb (73.483 kg)  BMI 28.7 kg/m2  SpO2 96% Constitutional: generally well-appearing Psychiatric: alert and oriented x3 Abdomen: soft, nontender, nondistended, no obvious ascites, no peritoneal signs, normal bowel sounds     Assessment and plan: 57 y.o. female with recurrent adenoma  Colonoscopy today

## 2013-01-03 NOTE — Transfer of Care (Signed)
Immediate Anesthesia Transfer of Care Note  Patient: Kristina Mckinney  Procedure(s) Performed: Procedure(s): COLONOSCOPY WITH PROPOFOL (N/A)  Patient Location: PACU  Anesthesia Type:MAC  Level of Consciousness: awake, alert , oriented and patient cooperative  Airway & Oxygen Therapy: Patient Spontanous Breathing and Patient connected to nasal cannula oxygen  Post-op Assessment: Report given to PACU RN and Post -op Vital signs reviewed and stable  Post vital signs: Reviewed and stable  Complications: No apparent anesthesia complications

## 2013-01-03 NOTE — Preoperative (Signed)
Beta Blockers   Reason not to administer Beta Blockers:Not Applicable 

## 2013-01-03 NOTE — Anesthesia Postprocedure Evaluation (Signed)
  Anesthesia Post-op Note  Patient: Kristina Mckinney  Procedure(s) Performed: Procedure(s) (LRB): COLONOSCOPY WITH PROPOFOL (N/A)  Patient Location: PACU  Anesthesia Type: MAC  Level of Consciousness: awake and alert   Airway and Oxygen Therapy: Patient Spontanous Breathing  Post-op Pain: mild  Post-op Assessment: Post-op Vital signs reviewed, Patient's Cardiovascular Status Stable, Respiratory Function Stable, Patent Airway and No signs of Nausea or vomiting  Last Vitals:  Filed Vitals:   01/03/13 0800  BP: 104/59  Pulse:   Temp:   Resp: 12    Post-op Vital Signs: stable   Complications: No apparent anesthesia complications

## 2013-01-04 ENCOUNTER — Encounter (HOSPITAL_COMMUNITY): Payer: Self-pay | Admitting: Gastroenterology

## 2013-01-07 ENCOUNTER — Encounter: Payer: Self-pay | Admitting: Gastroenterology

## 2013-01-11 ENCOUNTER — Ambulatory Visit (INDEPENDENT_AMBULATORY_CARE_PROVIDER_SITE_OTHER): Payer: Self-pay | Admitting: Family Medicine

## 2013-01-11 DIAGNOSIS — R7303 Prediabetes: Secondary | ICD-10-CM | POA: Insufficient documentation

## 2013-01-11 DIAGNOSIS — R7309 Other abnormal glucose: Secondary | ICD-10-CM

## 2013-01-11 NOTE — Progress Notes (Signed)
Late Entry for visit 11/16/2012  Patient presents today for 3 month Pre-Diabetes follow-up as part of the employer-sponsored Link to Wellness program. Medications and glucose readings have been reviewed. I have also discussed with patient lifestyle interventions such as healthy eating choices and physical activity. Details of this visit can be found in Care Tracker documenting program available through [facility]). Patient has set a series of personal goals and will follow-up in 3 months for further review of Pre-Diabetes.   Megan D. Vivia Ewing, PharmD, BCPS

## 2013-02-05 NOTE — Progress Notes (Signed)
Patient ID: Kristina Mckinney, female   DOB: 30-May-1956, 57 y.o.   MRN: [id number] ATTENDING PHYSICIAN NOTE: I have reviewed the chart and agree with the plan as detailed above. Denny Levy MD Pager [phone]

## 2013-02-22 NOTE — Progress Notes (Signed)
Established Patient - Follow-Up Evaluation  [facility] Consult - Clinical Pharmacist  Care Team Member: Gerre Pebbles     Subjective:  Patient presents for a 3 month follow up appointment for an employer sponsored wellness program, Link to Wellness. She reports that there are no changes to her health since her last appointment. She last had an appointment in March with Dr. Clovis Riley.    Disease Assessments:  Diabetes:  Type of Diabetes: Pre-Diabetes; uses glucometer; checks feet daily; takes medications as prescribed; does not take an aspirin a day; MD managing Diabetes Dr. Clovis Riley; Sees Diabetes provider 1 time a year;  checks blood glucose 2-6 times a week;  7 day CBG average 108; 14 day CBG average 110; 30 day CBG average 108; Lowest CBG 99; Highest CBG 119; hypoglycemia frequency none; Other Diabetes History: Patient is checking blood sugar once daily monday- friday fasting before breakfast.  She has pre-diabetes and is managing it with lifestyle modifications and no medications.    Social History:  Caffeine use: coffee 1 per day; Exercise adherence 5 days or more days a week; Denies alcohol use; Social work consult was not done; Denies drug use; Medication adherence adherent; Diet adherence 50-75% of the time Breakfast: oatmeal, raisins, apple   Patient can afford medications; Patient knows the purpose/use of medications; 230 minutes of exercise per week.  Occupation: Patient Transport- [employer]   Physical Activity- stationary bike in the morning for 15 minutes daily before she starts to work; Mondays and Wednesdays to the [employer] 30-40 minutes, doing weight training and also walking around the track.   Nutrition- patient has started using My Fitness Pal and is tracking the food she eats. She states that she isn't 100% perfect on tracking, but she is putting information into the program more often than not.   24 hour recall- B- cereal & milk L- hamurger helper, romaine lettuce D-  Chinese Shrimp with Broccoli, pork fried rice Snacks- apple, mini bagel  Patient has gained ~8 pounds in the last 6 months. She attributes this to increased snacking, especially on sweets. Her goal intake for the day is less than 1500 kcal. On review of her food diary several of her entries appear to be incorrect. She did not know how to change the entries of her portion sizes. I showed her how to do this so this may help in her recording.     Hemoglobin A1c: 02/22/2013 6.0    Colonoscopy: 12/28/2012  Dilated Eye Exam: 01/07/2013    Assessment/Plan:  Patient is a 57 year old female with pre-diabetes. A1C today was 6.0% which is meeting goal of less than 6.5%.   Patinet is concerned with her recent weight gain and wants to get back in shape. She states that she needs to put away the sweets, especially jelly beans. I encouraged her to limit sweets and to continue tracking her food intake with my fitness pal. She states that she is often very hungry mid-morning, and through looking at her food diary, she is not eating very much with breakfast (less than 300 kcal). She has an active job as a patient transporter and encouraged her to include more protein with breakfast by switching to a high protein cereal, switch from regular oatmeal to steel cut oats and to start eating yogurt or cheese with breakfast. Patient is also snacking during the day, and often it is peanut butter and graham crackers that she gets on the unit. I encouraged her to make her snacks  fruits, vegetables or a protein source like cheese.   Physical activity continues to go well. I encouraged her to get in as much as she can, as increased physical activity would likely aid in her weight loss efforts.   Will fax A1C results to Dr. Quita Skye office. Follow up with patient in 3 months..    Goals for Next Visit-  1. Increase protein intake with breakfast. Ways to do this- yogurt, cereals with higher protein content like Kashi,  cheese, boiled egg.  2. Continue physical activity- as much as you can.   Next appointment with me is- Friday July 18th at 3:30 PM.      Kristina Mckinney. Vivia Ewing, PharmD, BCPS

## 2013-02-25 ENCOUNTER — Encounter: Payer: Self-pay | Admitting: *Deleted

## 2013-02-25 ENCOUNTER — Ambulatory Visit (INDEPENDENT_AMBULATORY_CARE_PROVIDER_SITE_OTHER): Payer: Self-pay | Admitting: Family Medicine

## 2013-02-25 VITALS — BP 119/81 | HR 81 | Ht 63.0 in | Wt 165.0 lb

## 2013-02-25 DIAGNOSIS — R7303 Prediabetes: Secondary | ICD-10-CM

## 2013-02-25 DIAGNOSIS — R7309 Other abnormal glucose: Secondary | ICD-10-CM

## 2013-03-11 NOTE — Progress Notes (Signed)
Patient ID: Kristina Mckinney, female   DOB: 04/04/1956, 56 y.o.   MRN: [id number] ATTENDING PHYSICIAN NOTE: I have reviewed the chart and agree with the plan as detailed above. Nancy Manuele MD Pager [phone]  

## 2013-05-24 ENCOUNTER — Ambulatory Visit (INDEPENDENT_AMBULATORY_CARE_PROVIDER_SITE_OTHER): Payer: 59 | Admitting: Family Medicine

## 2013-05-24 VITALS — BP 138/86 | HR 78 | Ht 63.0 in | Wt 168.2 lb

## 2013-05-24 DIAGNOSIS — R7309 Other abnormal glucose: Secondary | ICD-10-CM

## 2013-05-24 NOTE — Progress Notes (Signed)
Subjective:  Patient presents today for 3 month pre-diabetes follow-up as part of the employer-sponsored Link to Wellness program. She is not taking any medications for DM at this time. Patient also continues on daily ACEi. Patient has a pending appointment with Dr. Traci Sermon next month. No med changes or major health changes at this time.    Patient has gained 3 pounds since her last visit and she voiced her frustration with this increase. She states she is tracking her food through My Fitness Pal and she is eating around 1500 kcal a day.    Disease Assessments:  Diabetes:  Type of Diabetes: Pre-Diabetes; uses glucometer; checks feet daily; takes medications as prescribed; does not take an aspirin a day; MD managing Diabetes Dr. Clovis Riley; Sees Diabetes provider 1 time a year; checks blood glucose 2-6 times a week; hypoglycemia frequency none;   7 day CBG average 109; 14 day CBG average 111; 30 day CBG average 112; Highest CBG 127; Lowest CBG 98;   Other Diabetes History: Patient is checking blood sugar once daily monday- friday fasting before breakfast.  She has pre-diabetes and is managing it with lifestyle modifications and no medications.    Physical Activity-  She has increased her physical activity and is doing 5 more minutes and is up to 40 minutes 5 days a week. On the weekends she is getting 30-45 minutes each day. She is going to the [facility] and then is also using a stationary bike in the morning. She is also coming early to work and getting 15-20 minutes on the elliptical machine at the gym here at work.   Nutrition-  She is using My Fitness Pal and is averaging about 1500 calories a day. She is eating snacks while she is working. She is eating salads with lunch.   B- oatmeal, 1/4 cup raisins, wheat germ, flax seeds ground, sometimes a piece of fruit (1/2 of banana or grapes)  snack- greek yogurt or fruit (strawberry or blueberries)  L- chicken, salad, carrots, tomatoes, cucumbers;  crackers or rice worx  D- meat, vegetables, sometimes a starch, extra vegetables instead of extra starches. She has been measuring out 1/4 cup of starches.   snack before bedtime- measure out 1/2 TB peanut butter with some crackers; sometimes popcorn (light)  She tries not to be a lot of sweet snacks. She is sometimes snacking on sweet things at work and also at [employer].   Hemoglobin A1c: 02/22/2013 6.0    Colonoscopy: 12/28/2012  Dilated Eye Exam: 01/07/2013   Vital Signs:  05/24/2013 4:14 PM (EST) Blood Pressure 138 / 86 mm/HgBMI 29.8; Height 5 ft 3 in; Pulse Rate 78 bpm; Weight 168.2 lbs      Assessment/Plan: Patient is a 57 year old female with Pre-diabetes. Last A1C was 6.0% and at goal. She has a pending appointment next month with Dr. Clovis Riley- I will defer A1C testing to that visit with him. Patient has gained 3 pounds since her last appointment with me. She is very frustrated over this and was teary during our visit. She is tracking all of her food intake and usually averages around 1500-1600 calories daily. She has an active job as a patient transporter, and I wonder if maybe she isn't getting enough calories. She admits to being hungry throughout the day. I have made a referral for her to see a dietitian with the [facility] for weight loss.   Patient continues to complete daily physical activity and has increased duration since our last visit. Congratulated  patient on continuing to exercise.   Patient had some question regarding the correct dose of her blood pressure medication. She has been taking plain lisinopril, but she states that the last time she saw Dr. Clovis Riley he wanted her on a diuretic. She has an older prescription for lisinopril/hctz which I refilled today. Patient agreed to discuss the matter with Dr. Clovis Riley at her upcoming appointment.   Follow up with patient in 3 months. .    Goals for Next Visit-  1. Weight Loss Goal- 5 pounds before your next visit with me.  2.  Ideas for more protein in the morning- switch to steel cut oats, add cheese, yogurt. Protein for lunch- add beans, chicken.  3. When you are hungry- eat a small snack. Ideas- fruit, small amount of peanut butter and crackers.   Next appointment with me is Friday October 17th at 3:30 PM.

## 2013-06-04 NOTE — Progress Notes (Signed)
Patient ID: Kristina Mckinney, female   DOB: 12/11/1955, 57 y.o.   MRN: [id number] ATTENDING PHYSICIAN NOTE: I have reviewed the chart and agree with the plan as detailed above. Caylor Cerino MD Pager [phone]  

## 2013-08-09 ENCOUNTER — Ambulatory Visit: Payer: 59 | Admitting: *Deleted

## 2013-08-23 ENCOUNTER — Ambulatory Visit (INDEPENDENT_AMBULATORY_CARE_PROVIDER_SITE_OTHER): Payer: 59 | Admitting: Family Medicine

## 2013-08-23 VITALS — BP 106/84 | HR 75 | Ht 63.0 in | Wt 165.0 lb

## 2013-08-23 DIAGNOSIS — E119 Type 2 diabetes mellitus without complications: Secondary | ICD-10-CM | POA: Insufficient documentation

## 2013-08-23 NOTE — Progress Notes (Signed)
Subjective:  Patient presents today for 3 month diabetes follow-up as part of the employer-sponsored Link to Wellness program. She is not taking any medications for diabetes at this time. Patient also continues on daily ACEi.   No medication changes. Her last appointment with Dr. Clovis Riley was in September. Her A1C was 6.6%. This is increased from her last visit with me and has crossed the diagnostic threshold for DM2. She also had her cholesterol checked. LDL was 131 and Dr. Clovis Riley opted to not treat with medication.   Patient has lost 3 pounds since her last appointment with me. She has been trying to eat more salads and increase physical activity with a goal of 30 minutes daily. 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    Disease Assessments:  Diabetes:  uses glucometer; takes medications as prescribed; does not take an aspirin a day; MD managing Diabetes Dr. Clovis Riley; Sees Diabetes provider 1 time a year; checks blood glucose 2-6 times a week; hypoglycemia frequency none;   Type of Diabetes: Type 2; Highest CBG 126; Lowest CBG 100; checks feet weekly; Other Diabetes History: Last A1C was 6.6% and meets criteria for DM2 now.   She is checking her blood sugar once daily. She did not bring her meter with her today. Highs and lows are patient reported. She reports that the majority of the time her numbers are running between 105-114.    Physical Activity-   She is still going to the [facility] three times a week for at least 40 minutes. Every day she is riding the stationary bike for 20 minutes (even on Saturday and Sunday).   Nutrition-   She has switched to using a smaller plate and she thinks that it has helped. She has an appointment pending to see the dietitian next week.       Hemoglobin A1c: 07/15/2013 6.6    Colonoscopy: 12/28/2012  Dilated Eye Exam: 01/07/2013  Flu vaccine: 08/12/2013  Other Preventive Care Notes: Dental Visit- June 2014       Vital Signs:   08/23/2013 3:38 PM (EST) Blood Pressure 106 / 84 mm/HgBMI 29.2; Height 5 ft 3 in; Pulse Rate 75 bpm; Weight 165 lbs    Testing:  Blood Sugar Tests:  Hemoglobin A1c: 6.6 resulted on 07/15/2013    Historical Lab Results:  Total Cholesterol 208; Triglycerides 43; HDL Cholesterol 68; LDL Cholesterol 131  Labs from [place], 07/15/2013    Assessment/Plan: Patient is a 57 year old female. A1C has increased from 6.0% at last visit to 6.6% in September with Dr. Clovis Riley and she now meets diagnostic criteria for type 2 diabetes. Dr. Clovis Riley did not start any medication for her at her most recent appointment and recommended intensifying lifestyle changes.   A1C increase likely coincides with patient's 10 pound weight gain over the past year. At her last visit with me we discussed making dietary changes and I referred her to see a dietitian. She has a pending visit with RD at the [facility] in a few weeks. She has added more protein to her morning meal and snack and reports that this has helped her feel fuller and in consequence, she is not overeating at lunch.   Patient has also increased physical activity and is now consistent with using the stationary bicycle every day for 20 minutes. I discussed with patient the concept of energy balance and reviewed that a calorie defecit of 3500 kcal would result in a 1 pound weight loss. Likely  patient's weight loss since her last appointment with me 3 months ago is a result of increased physical activity. I encouraged patient to continue physical activity and increase whenever possible.   I also explained to patient how to sign up for the new wellness awards given through [facility]. I showed patient the new website and how to claim badges for tobacco free, annual physical and how to complete the wellness profile. Even though patient's BMI is above 27, because she is meeting with a dietitian, she can claim the healthy weight award.   Follow up with patient in 3 months..     Goals for Next Visit-  1. Continue losing weight.  2. Continue using the exercise bike in the morning, get to the [facility] to exercise at least three days a week. Any extra time you can exercise will help to increase weight loss.  3. Keep your appointment with the dietitian.   Next appointment with me is Monday January 19th at 3 PM.

## 2013-08-26 ENCOUNTER — Other Ambulatory Visit (HOSPITAL_COMMUNITY): Payer: Self-pay | Admitting: Family Medicine

## 2013-08-26 DIAGNOSIS — Z1231 Encounter for screening mammogram for malignant neoplasm of breast: Secondary | ICD-10-CM

## 2013-08-30 ENCOUNTER — Encounter: Payer: 59 | Attending: Family Medicine | Admitting: *Deleted

## 2013-08-30 ENCOUNTER — Encounter: Payer: Self-pay | Admitting: *Deleted

## 2013-08-30 VITALS — Ht 61.75 in | Wt 166.6 lb

## 2013-08-30 DIAGNOSIS — R7303 Prediabetes: Secondary | ICD-10-CM

## 2013-08-30 DIAGNOSIS — Z713 Dietary counseling and surveillance: Secondary | ICD-10-CM | POA: Insufficient documentation

## 2013-08-30 DIAGNOSIS — R7309 Other abnormal glucose: Secondary | ICD-10-CM | POA: Insufficient documentation

## 2013-08-30 DIAGNOSIS — E669 Obesity, unspecified: Secondary | ICD-10-CM | POA: Insufficient documentation

## 2013-08-30 NOTE — Progress Notes (Signed)
  Medical Nutrition Therapy:  Appt start time: 0930 end time:  1030.  Assessment:  Primary concerns today: Kristina Mckinney is here for nutrition counseling pertaining to overweight status. She was referred through the [facility] to Wellness program.  Kristina Mckinney reports that her weight has fluctuating after she had children.  She exercises on and off and deals with stressors and her weight yo-yoed some more.  She went to the [facility] program and lost some weight, but then she gained it back over the past year.  She isn't sure what has made her weight pick back up this year.  She is using My Fitness Pal for 8 months with a goal 1500 calories/day, but she has been eating closer to 1200 calories.  Her heaviest weight as an adult is 180 pounds.  Her lowest weight as an adult was 150 pounds and her most consistent weight is 160 lb.  She would like to weigh 150-160.  She has prediabetes and sometimes checks her glucose.  Sometimes her glucose runs high if she's eaten too many sweets the day before.  Preferred Learning Style:   No preference indicated   Learning Readiness:   Ready   MEDICATIONS: see list    DIETARY INTAKE:  Usual eating pattern includes 3 meals and 2-3 snacks per day.  Everyday foods include protein, starch, fruit, vegetable, sweets.  Avoided foods include none.    24-hr recall:  B ( AM): 1 cup oatmeal with 1/4 cup raisins and wheat germ and flax seed.  Might have fruit too.  Drinks coffee with Equal and cream Snk ( AM): greek yogurt, fruit (berries usually); oat and honey granola bar  L ( PM): leftovers.  Salad with cucumbers, carrots, tomatoes, light [name] dressing.  Cheese stick and chicken.  Sometimes crackers Snk ( PM): not every day.  Might have granola bar D ( PM): meat ([place], chicken).  [name] a couple days/week.  Usually doesn't eat starch unless has a potato or rice and gravy.  Tries to eat lots of vegtables Snk ( PM): peanuts (usually too much).  Chocolate cookie or 2 Beverages:  water  Usual physical activity: stationary bike 20-30 minutes/day; [employer] 3 times/week 30 minutes of cardio.  Sometimes lifts weights  Estimated energy needs: 1500 calories 170 g carbohydrates 112 g protein 42 g fat  Progress Towards Goal(s):  In progress.   Nutritional Diagnosis:  NI-1.4 Inadequate energy intake As related to calorie restriction and increased exercise.  As evidenced by dietary recall.    Intervention:  Nutrition counseling provided Goals: Lift weights 1-2 time/week: arms and legs Add protein to breakfast Limit carb portions and increase protein and veggies Snack on greek yogurt, 1 handful of peanuts, [employer] protein bar; cheese stick Limit candies :-)  Aim for 1500 calories, instead of 1200, but increase protein and veggies, not necessarily carb  Teaching Method Utilized:  Visual- MyPlate model Auditory   Barriers to learning/adherence to lifestyle change: adherence to internal hunger and fullness cues  Demonstrated degree of understanding via:  Teach Back   Monitoring/Evaluation:  Dietary intake, exercise, and body weight prn.

## 2013-08-30 NOTE — Patient Instructions (Signed)
Lift weights 1-2 time/week: arms and legs Add protein to breakfast Limit carb portions and increase protein and veggies Snack on greek yogurt, 1 handful of peanuts, [employer] protein bar; cheese stick Limit candies :-)  Aim for 1500 calories, instead of 1200, but increase protein and veggies, not necessarily carb

## 2013-09-10 ENCOUNTER — Ambulatory Visit (HOSPITAL_COMMUNITY): Payer: 59

## 2013-09-19 ENCOUNTER — Ambulatory Visit (HOSPITAL_COMMUNITY): Payer: 59

## 2013-09-20 ENCOUNTER — Ambulatory Visit (HOSPITAL_COMMUNITY)
Admission: RE | Admit: 2013-09-20 | Discharge: 2013-09-20 | Disposition: A | Discharge status: Home / Self Care | Payer: 59 | Source: Ambulatory Visit | Attending: Family Medicine | Admitting: Family Medicine

## 2013-09-20 DIAGNOSIS — Z1231 Encounter for screening mammogram for malignant neoplasm of breast: Secondary | ICD-10-CM | POA: Insufficient documentation

## 2013-11-29 ENCOUNTER — Ambulatory Visit (INDEPENDENT_AMBULATORY_CARE_PROVIDER_SITE_OTHER): Payer: 59 | Admitting: Family Medicine

## 2013-11-29 VITALS — BP 108/86 | Ht 63.0 in | Wt 164.2 lb

## 2013-11-29 DIAGNOSIS — E119 Type 2 diabetes mellitus without complications: Secondary | ICD-10-CM

## 2013-12-02 NOTE — Progress Notes (Signed)
Subjective:  Patient presents today for 3 month diabetes follow-up as part of the employer-sponsored Link to Wellness program. Patient is not currently taking any medications to treat diabetes. Patient also continues on daily ACEi. Most recent MD follow-up was in September. Next appointment with Dr. Alroy Dust is in March. No major health changes since her last appointment with me.   Patient reports that she has been eating more sweets lately. With the holidays she was eating more. Her husband has been buying more cookies and there is generally more sweets around the house.   Her weight is down about 1 pound since her last appointment with me, despite admitting that she feels she is eating more sweets (eating 2-3 cookies daily). However, her physical activity has increased since her last appointment with me and she is exercising nearly every day.      Disease Assessments:  Diabetes:  uses glucometer; takes medications as prescribed; does not take an aspirin a day; MD managing Diabetes Dr. Alroy Dust; Sees Diabetes provider 1 time a year; checks blood glucose 2-6 times a week; hypoglycemia frequency none; Type of Diabetes: Type 2; checks feet weekly;   7 day CBG average 134; 14 day CBG average 131; 30 day CBG average 132; Highest CBG 144; Lowest CBG 100;   Other Diabetes History: She is checking her blood sugar once daily fasting Mondays- Fridays. She does not check on the weekends because she is not motivated to do it.   Monday mornings her blood sugar is usually >140. She states that this is because she is not exercising on Sundays and she is eating worse on Sunday (more sweets)    Physical Activity-   She is still going to the [facility] three times a week for at least 40 minutes. She has been adding weight machines when she goes also. Every day she is riding the stationary bike for 20 minutes (even on Saturday and Sunday).   Nutrition-   She reports that she has been eating several cookies each day  in the evening (usually oreos or chips ahoy). She states that she has also been eating more peanuts. She is considering starting weight watchers. She has a Mud[employer] that is doing [employer].   She saw an RD a few months ago. She states that it was a helpful visit.   She states that she is debating whether she should start weight watchers or if she should return to the dietiatian. She states that she knows what she needs to do, but finds it difficult to cut back on treats during the week, especially when she has a hard day at work.     Hemoglobin A1c: 11/29/2013 LTW [facility] 6.4    Colonoscopy: 12/28/2012  Dilated Eye Exam: 01/07/2013  Flu vaccine: 08/12/2013  Other Preventive Care Notes: Dental Visit- December 2014       Vital Signs:  11/29/2013 9:50 AM (EST) Blood Pressure 108 / 86 mm/HgBMI 29.1; Height 5 ft 3 in; Weight 164.2 lbs    Testing:  Blood Sugar Tests:  Hemoglobin A1c: 6.4 via MCOP LTW resulted on 11/29/2013     Assessment/Plan: Patient is a 58 year old female with DM2. A1C today was slightly improved at 6.4% (down from 6.6% in September). She has lost 1 pound since her last appointment with me, and she is more physically active now than she was at her last appointment with me. I explained to patient that even though she doesn't think that she is eating very well now,  the weight loss and physical activity have helped to lower her A1C.   Discussed with patient working to eliminate sweets from her diet. She states that she really likes eating cookies in the evenings and would find it hard to completely eliminate them from her diet. We came up with a goal to limit cookies to 2 days a week.   Congratulated patient on her success so far at increasing physical activity and encourgaed her to continue.   I will fax A1C results to Dr. Alroy Dust. Follow up with patient in 2 months prior to my maternity leave..    Goals For next Visit-  1. Think about what you want to do to help  you achieve weight loss. Ideas- going back to the dietitian or joining weight watchers. After you think about it, make a decision on what you want to do.  2. Cut back to 2 days a week for eating cookies. Days- Sundays and 1 day during the week (day of your choosing) .  3. Continue physical activity.  4. Make sure that you are logging your physical activity into the [employer] website.  Next appointment to see me is Friday March 27th at 9:15 AM.

## 2014-01-13 ENCOUNTER — Encounter: Payer: Self-pay | Admitting: Gastroenterology

## 2014-01-31 ENCOUNTER — Ambulatory Visit (INDEPENDENT_AMBULATORY_CARE_PROVIDER_SITE_OTHER): Payer: Self-pay | Admitting: Family Medicine

## 2014-01-31 VITALS — BP 118/80 | HR 71 | Ht 63.0 in | Wt 164.8 lb

## 2014-01-31 DIAGNOSIS — E119 Type 2 diabetes mellitus without complications: Secondary | ICD-10-CM

## 2014-01-31 NOTE — Progress Notes (Signed)
Subjective:  Patient presents today for 3 month diabetes follow-up as part of the employer-sponsored Link to Wellness program. Current diabetes regimen includes no medications. Patient also continues on daily ACEi.   Patient reports no medication changes and no health changes since her last appointment with me.   Her last appointment with Dr. Alroy Dust was earlier this month. She reports increased stomach pain also since her last appointment with me. She has a pending appointment with her gastroenterologist in April.   Last A1C with me was 6.4% at the end of January.   Her Grandmother passed away 2 weeks ago and she reports that she has not been in her normal routine lately. She has been eating more in the past couple of weeks.    Disease Assessments:  Diabetes:  uses glucometer; takes medications as prescribed; does not take an aspirin a day; MD managing Diabetes Dr. Alroy Dust; Sees Diabetes provider 1 time a year; checks blood glucose 2-6 times a week; hypoglycemia frequency none; Type of Diabetes: Type 2; checks feet weekly;   7 day CBG average 129; 14 day CBG average 129; 30 day CBG average 126; Highest CBG 153; Lowest CBG 112; Other Diabetes History: She is checking her blood sugar once daily fasting Mondays- Fridays. She does not check on the weekends because she is not motivated to do it.   Averages are about the same as last time- about 5 points lower.      Physical Activity-   She is still going to the [facility] three times a week for at least 40 minutes. She has been adding weight machines when she goes also. Every day she is riding the stationary bike for 20 minutes in the morning (even on Saturday and Sunday).   Nutrition-   She reports that she was doing good with having sweets only two days a week, but then lately she has been doing every day with sweets. She reports that last couple of weeks have been bad. She hasn't been tracking her food with myfitness pal lately.        Hemoglobin A1c: 11/29/2013 LTW [facility]OP 6.4    Colonoscopy: 12/28/2012  Dilated Eye Exam: 01/07/2013  Flu vaccine: 08/12/2013  Other Preventive Care Notes: Dental Visit- December 2014       Assessment/Plan: Patient is a 58 year female with DM2. Most recent A1C in January was 6.4% and is meeting goal of less than 7%. Weight is stable since last appointment with me. Patient continues with physical activity and is exercising most days of the week. She reports that she wants to increase the time that she is using the stationary bike at the employee gym at work. She has logged in 111 workouts into the [employer] and has completed all 5 of her wellness badges for the new incentive program. I congratulated patient on her success and encouraged her to continue.   Patient continues to struggle with consumption of sweets. At her last visit we made the goal of cutting back to twice a week and she reports she was doing well for a few weeks and then went back to daily sweet snacking. Patient agreed to try this goal again and aim to have sweets only twice a week.   Patient reports that she has a corn on her right foot and asked if [facility] had a diabetes center where she could get it taken care of. I recommended to her that she make an appointment to see a podiatrist. She has previously been  a patient at the [facility].   Follow up with patient after I return from maternity leave..    Goals for Next Visit-  1. Call and make an appointment to see the podiatrist at the [facility]. [phone] 2. Start tracking your food intake again on My Fitness Pal.  3. Continue physical activity as much as you can.  4. Limit sweets to two days a week.   Next Appointment is Friday August 14 at 9:15 AM.

## 2014-02-24 ENCOUNTER — Encounter: Payer: Self-pay | Admitting: Gastroenterology

## 2014-02-24 ENCOUNTER — Ambulatory Visit (INDEPENDENT_AMBULATORY_CARE_PROVIDER_SITE_OTHER): Payer: 59 | Admitting: Gastroenterology

## 2014-02-24 VITALS — BP 108/80 | HR 88 | Ht 61.75 in | Wt 164.5 lb

## 2014-02-24 DIAGNOSIS — K5900 Constipation, unspecified: Secondary | ICD-10-CM

## 2014-02-24 MED ORDER — POLYETHYLENE GLYCOL 3350 17 GM/SCOOP PO POWD: 1.0000 | Freq: Every day | ORAL | Status: DC

## 2014-02-24 NOTE — Patient Instructions (Signed)
Continue on your dialy fiber regimen. Take miralax as needed. You need repeat colonoscopy for previous polyps 12/2015 (already in our recall system).

## 2014-02-24 NOTE — Progress Notes (Signed)
Review of pertinent gastrointestinal problems: 1. Precancerous colon polyps.  TVA removed, [place] [facility] colonoscopy 2010. 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  Recall colonoscopy recommended at 3 year interval.  HPI: This is a  very pleasant 58 year old woman whom I last saw about a year ago at the time of a repeat colonoscopy.  She has been bothered by constipation lately. Bloating.  She tells me she has adhesions (noted during tubaligation 30 years ago).  She has always had trouble with constipation for many years.  She takes fiber about every day and miralax about once every few months.  Overall stable weight.  No overt bleeding.   Past Medical History  Diagnosis Date  . Allergy   . Asthma   . Diabetes mellitus     diet controlled  . Glaucoma   . Hypertension   . Colon polyps     Past Surgical History  Procedure Laterality Date  . Tubal ligation  1981  . Urethral cyst removal    . Tonsillectomy    . Colonoscopy w/ polypectomy      2010  . Colonoscopy  12/22/2011    Procedure: COLONOSCOPY;  Surgeon: Owens Loffler, MD;  Location: [facility];  Service: Endoscopy;  Laterality: N/A;  needs APC/MAC  . Hot hemostasis  12/22/2011    Procedure: HOT HEMOSTASIS (ARGON PLASMA COAGULATION/BICAP);  Surgeon: Owens Loffler, MD;  Location: [facility];  Service: Endoscopy;  Laterality: N/A;  . Colonoscopy with propofol N/A 01/03/2013    Procedure: COLONOSCOPY WITH PROPOFOL;  Surgeon: Milus Banister, MD;  Location: [facility];  Service: Endoscopy;  Laterality: N/A;    Current Outpatient Prescriptions  Medication Sig Dispense Refill  . ciclesonide (OMNARIS) 50 MCG/ACT nasal spray Place 2 sprays into both nostrils daily.      Marland Kitchen desloratadine (CLARINEX) 5 MG tablet Take 5 mg by mouth  daily.      . diclofenac (VOLTAREN) 75 MG EC tablet Take 75 mg by mouth 2 (two) times daily as needed.      . Fluticasone-Salmeterol (ADVAIR) 100-50 MCG/DOSE AEPB Inhale 1 puff into the lungs every 12 (twelve) hours.      Marland Kitchen lisinopril-hydrochlorothiazide (PRINZIDE,ZESTORETIC) 10-12.5 MG per tablet Take 1 tablet by mouth daily.      . montelukast (SINGULAIR) 10 MG tablet Take 10 mg by mouth at bedtime.      . Multiple Vitamin (MULTIVITAMIN WITH MINERALS) TABS Take 1 tablet by mouth daily.      . Travoprost, BAK Free, (TRAVATAN) 0.004 % SOLN ophthalmic solution Place 1 drop into both eyes at bedtime.       No current facility-administered medications for this visit.    Allergies as of 02/24/2014 - Review Complete 02/24/2014  Allergen Reaction Noted  . Penicillins  07/31/2008  . Tetracyclines & related Rash 12/15/2011    Family History  Problem Relation Age of Onset  . Colon cancer Neg Hx   . Esophageal cancer Neg Hx   . Rectal cancer Neg Hx   . Stomach cancer Neg Hx   . Colon polyps Mother   . Diabetes Brother   . Cancer Maternal Grandmother     type unknown    History   Social History  . Marital Status: Married    Spouse Name: N/A    Number of Children: 2  .  Years of Education: N/A   Occupational History  . [employer]   Social History Main Topics  . Smoking status: Former Smoker    Types: Cigarettes    Quit date: 01/04/1980  . Smokeless tobacco: Never Used  . Alcohol Use: No  . Drug Use: No  . Sexual Activity: Not on file   Other Topics Concern  . Not on file   Social History Narrative  . No narrative on file      Physical Exam: BP 108/80  Pulse 88  Ht 5' 1.75" (1.568 m)  Wt 164 lb 8 oz (74.617 kg)  BMI 30.35 kg/m2 Constitutional: generally well-appearing Psychiatric: alert and oriented x3 Abdomen: soft, nontender, nondistended, no obvious ascites, no peritoneal signs, normal bowel sounds     Assessment and plan: 58 y.o. female with  chronic constipation, personal history of adenomatous colon polyps  She has chronic constipation but is generally well treated with fiber supplements which she takes daily. She had concerns about taking it so often and I tried to address those concerns in d is certainly safe to take on an everyday basis. She can also take MiraLax on an as-needed basis. She will return for repeat colonoscopy in about February 2017 for her personal history of precancerous polyps.

## 2014-02-27 NOTE — Progress Notes (Signed)
Patient ID: Kristina Mckinney, female   DOB: 03/18/1956, 57 y.o.   MRN: [id number] ATTENDING PHYSICIAN NOTE: I have reviewed the chart and agree with the plan as detailed above. Sherriann Szuch MD Pager [phone]  

## 2014-03-03 NOTE — Progress Notes (Signed)
Patient ID: Kristina Mckinney, female   DOB: 09-14-1956, 58 y.o.   MRN: [id number] ATTENDING PHYSICIAN NOTE: I have reviewed the chart and agree with the plan as detailed above. Dorcas Mcmurray MD Pager [phone]

## 2014-04-07 ENCOUNTER — Encounter: Payer: Self-pay | Admitting: Podiatry

## 2014-04-07 ENCOUNTER — Ambulatory Visit (INDEPENDENT_AMBULATORY_CARE_PROVIDER_SITE_OTHER): Payer: 59 | Admitting: Podiatry

## 2014-04-07 VITALS — Ht 62.5 in | Wt 167.0 lb

## 2014-04-07 DIAGNOSIS — L84 Corns and callosities: Secondary | ICD-10-CM

## 2014-04-07 DIAGNOSIS — M2040 Other hammer toe(s) (acquired), unspecified foot: Secondary | ICD-10-CM

## 2014-04-07 NOTE — Progress Notes (Signed)
   Subjective:    Patient ID: Kristina Mckinney, female    DOB: [birth year], 58 y.o.   MRN: [id number]  HPI Comments: N corn L right 5th lateral toe D 2 months O worsened this weekend at the beach C hard skin, and pain A new shoes T medicated corn pads, and open shoes, history of surgery about 5 years ago  The last visit for this similar service was 01/29/2010   Review of Systems  All other systems reviewed and are negative.      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  As patient seems had minimal symptoms at this time I recommended periodic debridement. If, however, patient was unable to obtain adequate relief or relief amount of time surgical treatment could be considered.  Reappoint at patient's request

## 2014-04-07 NOTE — Patient Instructions (Signed)
Hammer Toes Hammer toes is a condition in which one or more of your toes is permanently flexed. CAUSES  This happens when a muscle imbalance or abnormal bone length makes your small toes buckle. This causes the toe joint to contract and the strong cord-like bands that attach muscles to the bones (tendons) in your toes to shorten.  SIGNS AND SYMPTOMS  Common symptoms of flexible hammer toes include:   A build up of skin cells (Corns). Corns occur where boney bumps come in frequent contact with hard surfaces. For example, where your shoes press and rub.  Irritation.  Inflammation.  Pain.  Limited motion in your toes DIAGNOSIS  Hammer toes are diagnosed through a physical exam of your toes.During the exam, your health care provider may try to reproduce your symptoms by manipulating your foot. Often, x-ray exams are done to determine the degree of deformity and to make sure that the cause is not a fracture.  TREATMENT  Hammer toes can be treated with corrective surgery. There are several types of surgical procedures that can treat hammer toes. The most common procedures include:  Arthroplasty A portion of the joint is surgically removed and your toe is straightened. The gap fills in with fibrous tissue. This procedure helps treat pain and deformity and helps restore function.  Fusion Cartilage between the two bones of the affected joint is taken out and the bones fuse together into one longer bone. This helps keep your toe stable and reduces pain but leaves your toe stiff, yet straight.  Implantation A portion of your bone is removed and replaced with an implant to restore motion.  Flexor tendon transfers This procedure repositions the tendons that curl the toes down (flexor tendons). This may be done to release the deforming force that causes your toe to buckle. Several of these procedures require fixing your toe with a pin that is visible at the tip of your toe. The pin keeps the toe  straight during healing. Your health care provider will remove the pin usually within 4 8 weeks after the procedure.  Document Released: 10/21/2000 Document Revised: 08/14/2013 Document Reviewed: 07/01/2013 Exit[employer] Patient Information 2014 [employer].  

## 2014-06-23 ENCOUNTER — Ambulatory Visit (INDEPENDENT_AMBULATORY_CARE_PROVIDER_SITE_OTHER): Payer: Self-pay | Admitting: Family Medicine

## 2014-06-23 VITALS — BP 94/74 | HR 81 | Ht 63.0 in | Wt 162.8 lb

## 2014-06-23 DIAGNOSIS — E119 Type 2 diabetes mellitus without complications: Secondary | ICD-10-CM

## 2014-06-23 NOTE — Progress Notes (Signed)
Subjective: Patient presents today for 3 month diabetes follow-up as part of the employer-sponsored Link to Wellness program.  She is not currently taking medication to treat diabetes.  Patient also continues on daily  ACEi. No med changes or major health changes at this time.  Patient got back from vacation this past week. She reports that she still exercised but she wasn't eating as good. Overall since her last visit with me she has been doing well. She has increased her physical activity to 60 minutes most days of the week. She reports that she has improved and isn't eating as many concentrated sweets and has been choosing fruit over sweets.  Last visit with Dr. Alroy Dust was in March. Her next appointment to see him will be in September. No recent A1C.  She has been checking her BP at home in the evening. She states that it is usually running 114/74.  She saw a podiatrist over the summer. She had a corn removed. She reports he told her that her blood flow was good and that her foot looked good.    Disease Assessments: Diabetes:  uses glucometer; takes medications as prescribed; does not take an aspirin a day; MD managing Diabetes Dr. Alroy Dust; Sees Diabetes provider 1 time a year; checks blood glucose 2-6 times a week; hypoglycemia frequency none; Type of Diabetes: Type 2;   checks feet weekly; 7 day CBG average 116; 14 day CBG average 116; 30 day CBG average 117; Highest CBG 135; Lowest CBG 104;   Other Diabetes History:  She is checking her blood sugar once daily fasting Mondays- Fridays. She does not check on the weekends because she is not motivated to do it. CBG averages are about 10 points lower than at her last visit. She denies hypoglycemia. She is not taking any medications to treat diabetes.   Most readings are in the 110s.  Tobacco Assessment:  Smoking Status: Former smoker; Last Reviewed: 06/23/2014   Social History:  Caffeine use: coffee 1 per day; Exercise adherence 5 days  or more days a week; Denies alcohol use; Social work consult was not done; No drug use; Medication adherence adherent; Patient can afford medications; Patient knows the purpose/use of medications;  300 minutes of exercise per week; Diet adherence Greater than 75% of the time   Occupation: Patient [place]    Physical Activity-   She is still going to the [facility] three times a week for at least 30 minutes. She has been adding weight machines when she goes also. Every day she is riding the stationary bike for 30 minutes in the morning (even on Saturday and Sunday). She is getting 10 minutes at lunch time at the weight room on the [facility]. She estimates that at least 5 days a week she is getting 60 minutes of physical activity. She reports that she feels smaller.   Nutrition-   She is using My Fitness Pal again to track her food. She estimates that she is getting 1500 kcal daily. She estimates that she is eating sweets 3-4 days a week.       Blood Sugar Tests:  Hemoglobin A1c: 6.3 via POCT resulted on 06/23/2014    Assessment/Plan:   Patient is a 58 year old female with DM2. A1C today was 6.3% and is meeting goal of less than 7%. Today's A1C is down from 6.4% in January. Weight is also down about 2 pounds for patient. CBG averages are about 10 points lower as well. Patient  has started tracking her food and is averaging around 1500 kcal. She has also increased physical activity and is getting 300 minutes most week.   I congratulated patient on her success and encouraged her to continue! I will fax A1C results to Dr. Alroy Dust. Follow up with patient in 4 months.  Goals for Next Visit-     1. Continue tracking food intake on my fitness pal.  2. Limit sweet intake to three days a week.  3. Continue physical activity. Aim for 300 minutes of physical activity each week.     Next visit to see me is Monday December 7th at 2 PM.

## 2014-06-24 ENCOUNTER — Encounter: Payer: Self-pay | Admitting: Family Medicine

## 2014-06-24 NOTE — Progress Notes (Signed)
Patient ID: Kristina Mckinney, female   DOB: [birth year], 58 y.o.   MRN: [id number] Reviewed: Agree with the documentation and management of our pharmacologist.

## 2014-09-02 ENCOUNTER — Other Ambulatory Visit (HOSPITAL_COMMUNITY): Payer: Self-pay | Admitting: Family Medicine

## 2014-09-02 DIAGNOSIS — Z1231 Encounter for screening mammogram for malignant neoplasm of breast: Secondary | ICD-10-CM

## 2014-09-04 ENCOUNTER — Encounter: Payer: Self-pay | Admitting: Family Medicine

## 2014-09-04 NOTE — Progress Notes (Signed)
Patient ID: Kristina Mckinney, female   DOB: 06/15/1956, 58 y.o.   MRN: [id number] Reviewed: Agree with the documentation and management of our [place] Pharmacologist. 

## 2014-09-26 ENCOUNTER — Ambulatory Visit (HOSPITAL_COMMUNITY)
Admission: RE | Admit: 2014-09-26 | Discharge: 2014-09-26 | Disposition: A | Discharge status: Home / Self Care | Payer: 59 | Source: Ambulatory Visit | Attending: Family Medicine | Admitting: Family Medicine

## 2014-09-26 DIAGNOSIS — Z1231 Encounter for screening mammogram for malignant neoplasm of breast: Secondary | ICD-10-CM | POA: Diagnosis present

## 2014-10-17 ENCOUNTER — Ambulatory Visit (INDEPENDENT_AMBULATORY_CARE_PROVIDER_SITE_OTHER): Payer: Self-pay | Admitting: Family Medicine

## 2014-10-17 VITALS — BP 98/66 | Wt 157.8 lb

## 2014-10-17 DIAGNOSIS — E119 Type 2 diabetes mellitus without complications: Secondary | ICD-10-CM

## 2014-10-17 NOTE — Assessment & Plan Note (Signed)
Subjective:  Patient presents today for 3 month diabetes follow-up as part of the employer-sponsored Link to Wellness program. Patient is not currently taking any medications to treat diabetes. Patient also continues on daily ACEi.   Patient reports that things have been better since her last visit with me. She is still tracking her food intake with My Fitness Pal. She has cut back to 1200 kcal daily. She is doing at least 40 minutes physical activity daily 7 days a week (this is increased from last visit). Weight is down 5 pounds since her last visit with me. She thinks that this is because she has increased her physical activity and has cut back on her portion sizes.   Disease Assessments:  Diabetes: uses glucometer; takes medications as prescribed; does not take an aspirin a day; MD managing Diabetes Dr. Alroy Dust; Sees Diabetes provider 1 time a year; checks blood glucose 2-6 times a week; hypoglycemia frequency none; Type of Diabetes: Type 2; checks feet weekly;   7 day CBG average 115; 14 day CBG average 111; 30 day CBG average 113; Highest CBG 128; Lowest CBG 90;   Other Diabetes History:  Patient states she is checking once daily in the morning Mondays- Fridays. She is checking fasting readings. The majority of readings are between 100-125.  A1C today was 6.2%.              Tobacco Assessment: Smoking Status: Former smoker; Last Reviewed: 10/13/2014  smoked for 7 years; Date quit smoking: 1980   Social History:  Caffeine use: coffee 1 per day; Exercise adherence 5 days or more days a week; Denies alcohol use; Social work consult was not done; No drug use; Medication adherence adherent; Patient can afford medications; Patient knows the purpose/use of medications; 300 minutes of exercise per week; Diet adherence Greater than 75% of the time Breakfast: oatmeal, raisins, apple  Lunch: meat, vegetable Dinner: broiled/fried meat, baked chicken, vegetables Dessert: cake, cookies Snacks:  fruit, PB crackers.  Occupation: [name]  Physical Activity-  She is exercising 7 days a week for at least 30 minutes, some days 40 minutes. She is doing a combination of cardio activity and weight lifting. She uses the elliptical machine and also does the recumbent bicycle for cardio exercise. She is still exercising with her grandchildren.  Nutrition-  She is using My Fitness Pal again to track her food. She has dropped to 1200 kcal daily   Preventive Care:    Hemoglobin A1c: 06/23/2014 [facility] MCOP 6.3    Colonoscopy: 12/28/2012  Dilated Eye Exam: 02/27/2014  Flu vaccine: 08/07/2014  Last mammography: 10/06/2014  Other Preventive Care Notes:  Dental Visit- June 2015      Vital Signs:  10/17/2014 2:02 PM (EST)Blood Pressure 98 / 66 mm/HgBMI 27.9; Height 5 ft 3 in; Weight 157.8 lbs   Testing:  Blood Sugar Tests: Hemoglobin A1c: 6.2 via POCT resulted on 10/13/2014          Care Planning:  Learning Preference Assessment:  Learner: Patient  Readiness to Learn Barriers: None  Teaching Method: Explanation  Evaluation of Learning: Can function independently and verbalize knowledge  Readiness to Change:  How important is your health to you? 10  How confident are you in working to improve your health? 10  How ready are you to change to improve your health? 8  Total Score: 9  Care Plan:  10/17/2014 2:02 PM (EST) (1)  Problem: BMI>25  Role: Clinical Pharmacist  Long Term Goal Continue  physical activity, at least 5 days a week.  Date Started: 10/13/2014     Assessment/Plan:  Patient is a 58 year old female with DM2. A1C today is 6.2% and is meeting goal of less than 7%. Patient continues to do well. She has lost 5 pounds since her last visit with me. She continues to engage in physical activity and is now exercising daily. She also continues to make healthy eating choices. Reviewed with patient how to claim badges for the wellness incentive website. I will  fax A1C results to Dr. Alroy Dust. Follow up with patient in 4 months.  Goals for Next Visit- 1. Continue physical activity. 2. Continue tracking your food on My Fitness Pal. 3. When you are going to holiday parties, just choose 1 sweet. Next appointment to see me is Friday April 8th at 3:30 PM.

## 2014-11-20 NOTE — Progress Notes (Signed)
Patient ID: Kristina Mckinney, female   DOB: 04-25-1956, 59 y.o.   MRN: [id number] Reviewed: Agree with the documentation and management of our [place].

## 2015-02-13 ENCOUNTER — Ambulatory Visit: Payer: 59 | Admitting: Pharmacist

## 2015-02-13 ENCOUNTER — Ambulatory Visit (INDEPENDENT_AMBULATORY_CARE_PROVIDER_SITE_OTHER): Payer: Self-pay | Admitting: Family Medicine

## 2015-02-13 VITALS — BP 92/64 | Ht 63.0 in | Wt 163.0 lb

## 2015-02-13 DIAGNOSIS — R7303 Prediabetes: Secondary | ICD-10-CM

## 2015-02-13 DIAGNOSIS — R7309 Other abnormal glucose: Secondary | ICD-10-CM

## 2015-02-13 NOTE — Progress Notes (Signed)
Subjective:  Patient presents today for 3 month diabetes follow-up as part of the employer-sponsored Link to Wellness program.  She is not currently taking any medications for diabetes.  Patient also continues on daily ASA, ACE Inhibitor and statin.  Patient states that her last visit with Dr. Alroy Dust was in February this year. A1C at that visit was 6.4%.  Patient has a pending appt for September 2016.  No med changes or major health changes at this time.   Assessment/Plan:  Patient is a 59  yo female/female with DM 1/2. Most recent A1C was 6.4  % which is at goal of less than 7%. Weight is increased from last visit with me by 5 pounds.   Lifestyle improvements:  Physical Activity- She is exercising 7 days a week for 40 minutes; combination of walking, elliptical machine and stationary bike. She is doing the weight machines two days a week.   Nutrition- Still tracking food on My Fitness Pal, aiming for 1200 -1400 kcal daily. She states that she hasn't been as strict at avoiding cookies and candy. Her husband is buying concentrated sweets and it is hard for her to avoid it.   Patient expressed frustration that despite daily physical activity she has gained weight since her last visit. She thinks that she has gained weight because she has not been as strict at avoiding sweets. She admits that late night snacking is a struggle for her.   Follow up with me in 3 months.   Goals for Next Visit:  1. When you are feeling like you need to snack at night, eat a rice cake or an apple instead of cookies, candy or concentrated sweets.  2. Continue physical activity. You are doing a great job with this!!    Next appointment to see me is: Friday July 15th at 2 PM.   Jinny Blossom D. Donneta Romberg, PharmD, BCPS, CDE [facility] to [place] Coordinator [phone]

## 2015-02-13 NOTE — Patient Instructions (Signed)
  Goals for Next Visit:  1. When you are feeling like you need to snack at night, eat a rice cake or an apple instead of cookies, candy or concentrated sweets.  2. Continue physical activity. You are doing a great job with this!!    Next appointment to see me is: Friday July 15th at 2 PM.

## 2015-02-27 NOTE — Progress Notes (Signed)
Patient ID: Kristina Mckinney, female   DOB: 06-03-1956, 59 y.o.   MRN: [id number] ATTENDING PHYSICIAN NOTE: I have reviewed the chart and agree with the plan as detailed above. Dorcas Mcmurray MD Pager [phone]

## 2015-04-03 ENCOUNTER — Encounter: Payer: Self-pay | Admitting: Pharmacist

## 2015-05-22 ENCOUNTER — Ambulatory Visit: Payer: 59 | Admitting: Pharmacist

## 2015-05-22 ENCOUNTER — Ambulatory Visit (INDEPENDENT_AMBULATORY_CARE_PROVIDER_SITE_OTHER): Payer: Self-pay | Admitting: Family Medicine

## 2015-05-22 VITALS — BP 96/62 | Ht 62.0 in | Wt 160.6 lb

## 2015-05-22 DIAGNOSIS — E119 Type 2 diabetes mellitus without complications: Secondary | ICD-10-CM

## 2015-05-22 LAB — POCT GLYCOSYLATED HEMOGLOBIN (HGB A1C): Hemoglobin A1C: 6.4

## 2015-05-22 NOTE — Patient Instructions (Signed)
Goals for Next Visit:  1. Make a decision on how you want to complete the last badge for the Live Life Well website. If you want to see a dietitian, call Monday to make an appointment.  [place] [address] [phone], ext. [phone] [email or web address]  2. Continue limiting sweet snacks to twice a week.  3. Continue physical activity.     Next appointment to see me is: Monday November 7th at 2 PM.

## 2015-05-22 NOTE — Progress Notes (Signed)
Subjective:  Patient presents today for 3 month diabetes follow-up as part of the employer-sponsored Link to Wellness program.  Patient is not currently taking any diabetes medications. Patient also continues on daily  ACE Inhibitor. No med changes or major health changes at this time.   Patient reports that she has increased physical activity and weight lifting since last appointment. Last appointment with Dr. Alroy Dust was in March. Next pending visit with PCP is in September. No recent A1C in results. I will check A1C today.   Assessment/Plan:  Patient is a 59 y.o. female with DM 2. Most recent A1C was 6.4 % which is at goal of less than 7%. Weight is decreased from last visit with me by 3 pounds.   CBG Review: Patient is checking fasting blood sugar 5 days a week (monday-Friday). No hypoglycemia noted. 7,14,30 day averages are similar to last visit. High/Low- 126/96.   Lifestyle improvements:  Physical Activity-  She continues to ride the stationary bicycle daily for 40 minutes. She also is lifting weights at the [facility] 3-4 times a week for 30 minutes.     Nutrition-  Patient states that she is still tracking all of her food into My Fitness Pal. She estimates she is eating between 1200 and 1400 calories daily. I recommended that she not restrict any further than 1200 calories daily. She states that with this much food she feels like she is getting enough to eat.   She states she has done better with not snacking at night on sweets. She is only snacking on sweets a couple of times a week (previously she was doing this daily). Weight is down almost 3 pounds since her last visit.    Follow up with me in 3 months. I will fax A1C results to Dr. Alroy Dust.    Goals for Next Visit:  1. Make a decision on how you want to complete the last badge for the Live Life Well website. If you want to see a dietitian, call Monday to make an appointment.  [place] [phone], ext. [phone] [email or web address]  2. Continue limiting sweet snacks to twice a week.  3. Continue physical activity.     Next appointment to see me is: Monday November 7th at 2 PM.    Jinny Blossom D. Donneta Romberg, PharmD, BCPS, CDE [place] [facility] to [place] Coordinator [phone]

## 2015-05-29 NOTE — Progress Notes (Signed)
Patient ID: Kristina Mckinney, female   DOB: 1956/03/21, 59 y.o.   MRN: [id number] ATTENDING PHYSICIAN NOTE: I have reviewed the chart and agree with the plan as detailed above. Dorcas Mcmurray MD Pager [phone]

## 2015-06-13 ENCOUNTER — Encounter: Payer: Self-pay | Admitting: Skilled Nursing Facility1

## 2015-06-13 ENCOUNTER — Encounter: Payer: 59 | Attending: Family Medicine | Admitting: Skilled Nursing Facility1

## 2015-06-13 VITALS — Ht 62.0 in | Wt 164.0 lb

## 2015-06-13 DIAGNOSIS — Z713 Dietary counseling and surveillance: Secondary | ICD-10-CM | POA: Insufficient documentation

## 2015-06-13 NOTE — Progress Notes (Signed)
  Patient was seen on 06/13/15 for the Weight Loss Class at the [facility]. The following learning objectives were met by the patient during this class:   Describe healthy choices in each food group  Describe portion size of foods  Use plate method for meal planning  Demonstrate how to read Nutrition Facts food label  Set realistic goals for weight loss, diet changes, and physical activity.   Goals:  1. Make healthy food choices in each food group.  2. Reduce portion size of foods.  3. Increase fruit and vegetable intake.  4. Use plate method for meal planning.  5. Increase physical activity.    Handouts given:   1. Nutrition Strategies for Weight Loss   2. Meal plan/portion card   3. MyPlate Planner   4. Weight Management Recipe Resources   5. Bake, Broil, Grill   

## 2015-07-21 ENCOUNTER — Other Ambulatory Visit (HOSPITAL_COMMUNITY)
Admission: RE | Admit: 2015-07-21 | Discharge: 2015-07-21 | Disposition: A | Discharge status: Home / Self Care | Payer: 59 | Source: Ambulatory Visit | Attending: Family Medicine | Admitting: Family Medicine

## 2015-07-21 ENCOUNTER — Other Ambulatory Visit: Payer: Self-pay | Admitting: Family Medicine

## 2015-07-21 DIAGNOSIS — Z124 Encounter for screening for malignant neoplasm of cervix: Secondary | ICD-10-CM | POA: Insufficient documentation

## 2015-07-23 LAB — CYTOLOGY - PAP

## 2015-08-27 ENCOUNTER — Other Ambulatory Visit: Payer: Self-pay | Admitting: Gastroenterology

## 2015-09-02 ENCOUNTER — Other Ambulatory Visit: Payer: Self-pay

## 2015-09-02 DIAGNOSIS — Z1231 Encounter for screening mammogram for malignant neoplasm of breast: Secondary | ICD-10-CM

## 2015-09-14 ENCOUNTER — Encounter: Payer: Self-pay | Admitting: Pharmacist

## 2015-09-14 ENCOUNTER — Ambulatory Visit (INDEPENDENT_AMBULATORY_CARE_PROVIDER_SITE_OTHER): Payer: Self-pay | Admitting: Family Medicine

## 2015-09-14 VITALS — BP 108/68 | Wt 163.0 lb

## 2015-09-14 DIAGNOSIS — E119 Type 2 diabetes mellitus without complications: Secondary | ICD-10-CM

## 2015-09-14 NOTE — Progress Notes (Signed)
Subjective:  Patient presents today for 3 month diabetes follow-up as part of the employer-sponsored Link to Wellness program.  Patient also continues on daily ACE Inhibitor.  Most recent MD follow-up was in August with Dr. Alroy Dust. A1C was 6.5% at that visit. Patient has a pending appt for March 2017. She declined an A1C test at this time.  No med changes or major health changes at this time.     Assessment:  Diabetes: Most recent A1C was 6.5 % which is at goal of less than 7%. Weight is stable from last visit with me.    CBG Review: She is checking once daily fasting (skips Saturday and Sunday).   Gave patient a new True Metrix meter. Faxed Dr. Virgilio Belling office for refill on testing supplies.   Lifestyle improvements:  Physical Activity-  No longer exercising at the [facility]. She has an exercise bike and uses that. She is also using the exercise room at work. Total she is exercising 40 minutes daily, 7 days a week. She is also walking.    Nutrition-  Patient admits that she has been eating candy and admits it is her downfall. She estimates that she has at least a small handful of M&Ms or other candy each day. She admits that she stress eats. She also eats candy and other concentrated sweets at night. She does report that she is trying to eat more fruits at night instead of candy or cookies.   She is still tracking food into My Fitness Pal. She estimates that she is getting around 1600 kcal daily.      Follow up with me in 3 months.    Plan/Goals for Next Visit:  1. Each day, limit yourself to 4 M&M pieces of candy. Savor these pieces of candy so that you are satisfied with less.  2. At night- instead of snacking on cookies or candy, have some fruit. Even if it is grapes :-)  3. Ask Dr. Alroy Dust at your next appointment if you should start taking a baby aspirin.     Next appointment to see me is: Friday February 10th at 2 PM.   Jinny Blossom D. Donneta Romberg, PharmD, BCPS, CDE Norm Parcel to  [place] Coordinator [phone]

## 2015-09-22 NOTE — Progress Notes (Signed)
I have reviewed this pharmacist's note and agree  

## 2015-10-09 ENCOUNTER — Ambulatory Visit
Admission: RE | Admit: 2015-10-09 | Discharge: 2015-10-09 | Disposition: A | Discharge status: Home / Self Care | Payer: 59 | Source: Ambulatory Visit

## 2015-10-09 DIAGNOSIS — Z1231 Encounter for screening mammogram for malignant neoplasm of breast: Secondary | ICD-10-CM

## 2015-11-08 HISTORY — PX: POLYPECTOMY: SHX149

## 2015-11-08 HISTORY — PX: COLONOSCOPY: SHX174

## 2015-11-13 DIAGNOSIS — J3089 Other allergic rhinitis: Secondary | ICD-10-CM | POA: Diagnosis not present

## 2015-11-13 DIAGNOSIS — J3081 Allergic rhinitis due to animal (cat) (dog) hair and dander: Secondary | ICD-10-CM | POA: Diagnosis not present

## 2015-11-13 DIAGNOSIS — J301 Allergic rhinitis due to pollen: Secondary | ICD-10-CM | POA: Diagnosis not present

## 2015-11-20 ENCOUNTER — Telehealth: Payer: Self-pay | Admitting: Gastroenterology

## 2015-11-20 DIAGNOSIS — J3089 Other allergic rhinitis: Secondary | ICD-10-CM | POA: Diagnosis not present

## 2015-11-20 DIAGNOSIS — J301 Allergic rhinitis due to pollen: Secondary | ICD-10-CM | POA: Diagnosis not present

## 2015-11-20 DIAGNOSIS — J3081 Allergic rhinitis due to animal (cat) (dog) hair and dander: Secondary | ICD-10-CM | POA: Diagnosis not present

## 2015-11-20 NOTE — Telephone Encounter (Signed)
Pt notified that she is not due until Feb and we will get her set up a little closer to time for insurance purposes.

## 2015-11-25 MED FILL — CLINDAMYCIN HCL 150 MG CAPS: 150 | 7 days supply | Qty: 28 | Fill #0

## 2015-11-27 DIAGNOSIS — J301 Allergic rhinitis due to pollen: Secondary | ICD-10-CM | POA: Diagnosis not present

## 2015-11-27 DIAGNOSIS — J3081 Allergic rhinitis due to animal (cat) (dog) hair and dander: Secondary | ICD-10-CM | POA: Diagnosis not present

## 2015-11-27 DIAGNOSIS — J3089 Other allergic rhinitis: Secondary | ICD-10-CM | POA: Diagnosis not present

## 2015-11-30 MED FILL — POLYETHYLENE GLYCOL 3350: 15 days supply | Qty: 255 | Fill #1

## 2015-12-02 MED FILL — ADVAIR 100/50 DISKUS: 100-50 | 90 days supply | Qty: 180 | Fill #0

## 2015-12-03 MED FILL — TRAVATAN Z 0.004% EYE DROP: 0.004 | 90 days supply | Qty: 8 | Fill #1

## 2015-12-04 DIAGNOSIS — J3081 Allergic rhinitis due to animal (cat) (dog) hair and dander: Secondary | ICD-10-CM | POA: Diagnosis not present

## 2015-12-04 DIAGNOSIS — J301 Allergic rhinitis due to pollen: Secondary | ICD-10-CM | POA: Diagnosis not present

## 2015-12-04 DIAGNOSIS — J3089 Other allergic rhinitis: Secondary | ICD-10-CM | POA: Diagnosis not present

## 2015-12-07 MED FILL — CLINDAMYCIN HCL 150 MG CAPS: 150 | 7 days supply | Qty: 28 | Fill #0

## 2015-12-15 MED FILL — AZITHROMYCIN 250 MG TABLET: 250 | 5 days supply | Qty: 6 | Fill #0

## 2015-12-18 ENCOUNTER — Ambulatory Visit: Payer: Self-pay | Admitting: Pharmacist

## 2015-12-18 ENCOUNTER — Encounter: Payer: Self-pay | Admitting: Pharmacist

## 2015-12-18 ENCOUNTER — Ambulatory Visit (INDEPENDENT_AMBULATORY_CARE_PROVIDER_SITE_OTHER): Payer: Self-pay | Admitting: Family Medicine

## 2015-12-18 VITALS — BP 100/70 | Ht 63.0 in | Wt 159.4 lb

## 2015-12-18 DIAGNOSIS — J3089 Other allergic rhinitis: Secondary | ICD-10-CM | POA: Diagnosis not present

## 2015-12-18 DIAGNOSIS — J301 Allergic rhinitis due to pollen: Secondary | ICD-10-CM | POA: Diagnosis not present

## 2015-12-18 DIAGNOSIS — E119 Type 2 diabetes mellitus without complications: Secondary | ICD-10-CM

## 2015-12-18 DIAGNOSIS — J3081 Allergic rhinitis due to animal (cat) (dog) hair and dander: Secondary | ICD-10-CM | POA: Diagnosis not present

## 2015-12-18 NOTE — Progress Notes (Signed)
Subjective:  Patient presents today for 3 month diabetes follow-up as part of the employer-sponsored Link to Wellness program.  Current diabetes regimen includes no medications for diabetes. Patient also continues on daily ACE Inhibitor. Most recent MD follow-up was Dr. Marlou Sa in September. Last A1C was 6.5% with Dr. Alroy Dust.  Patient has a pending appt for next month with Dr. Alroy Dust. She requested that I check her A1C today.  No major health changes at this time. Only medication changes- started azithromycin today for infection prophylaxis following a dental implant.     Assessment:  Diabetes: Most recent A1C was 6.8  % which is at goal of less than 7%. Weight is decreased from last visit with me.   A1C has increased since her last visit with me. Patient states that she has had a lingering dental infection that was treated in January with 2 rounds of clindamycin. Patient states that this was the time that she noticed fasting CBGs started to elevate. I explained to patient that an infection can increase CBGs and could be part of the reason A1C has increased. Patient is doing very well with physical activity and has also made good changes with her eating habits.    CBG Review: Patient did not bring her meter with her to the appointments. She states that her CBGs have been elevated lately. She noticed that fasting CBGs were elevated to 130 mg/dL. The highest she found was 140 mg/dL. She thinks that it is elevated because of stress.   Lifestyle improvements:  Physical Activity-  Patient is getting 40 minutes of physical activity 7 days a week. Sometimes she gets a little more. She is riding a stationary bike, sometimes the elliptical and treadmill. Mostly she is cycling. She is doing some weight training.   Nutrition-  She states that she is still trying to cut back on sweets and she is doing better than she has in the past. She is still having a cookie on Sunday. Other days in the week she is  avoiding candy and cookies (a struggle for her in the past).   She is now participating in [employer]. She has 30 daily points and is entering her points onto the computer. She states that most days she stays within 30 points and some days she is going over. She thinks that she is getting a few servings of fruit each day.    Follow up with me in 3 months. I will fax A1C results to Dr. Alroy Dust. Also recommended that patient may benefit from ASA 81 mg daily.    Plan/Goals for Next Visit:  1. Ask Dr. Alroy Dust at your next appointment if you should start taking a baby aspirin and a statin.  2. Continue physical activity daily. Increase weight lifting to 10 minutes three times a week. 3. Continue to follow weight watchers and track your food each day. Try to stay within your allotted points for the day and the week.     Next appointment to see me is: Friday May 12th at [place] Kristina Mckinney, PharmD, BCPS, CDE Kristina Mckinney to [place] Coordinator [phone]

## 2015-12-21 ENCOUNTER — Telehealth: Payer: Self-pay | Admitting: Gastroenterology

## 2015-12-23 MED FILL — DESLORATADINE 5 MG TABLET: 5 | 90 days supply | Qty: 90 | Fill #0

## 2015-12-23 NOTE — Telephone Encounter (Signed)
i think it is ok to do at [facility]

## 2015-12-23 NOTE — Telephone Encounter (Signed)
Does the pt need to have her procedure at the hospital?  Please advise

## 2015-12-24 ENCOUNTER — Encounter: Payer: Self-pay | Admitting: Gastroenterology

## 2015-12-24 NOTE — Telephone Encounter (Signed)
noted 

## 2015-12-24 NOTE — Telephone Encounter (Signed)
Left message on machine to call back  

## 2015-12-24 NOTE — Telephone Encounter (Signed)
Patient scheduled colon at [facility]

## 2015-12-25 DIAGNOSIS — J3081 Allergic rhinitis due to animal (cat) (dog) hair and dander: Secondary | ICD-10-CM | POA: Diagnosis not present

## 2015-12-25 DIAGNOSIS — J301 Allergic rhinitis due to pollen: Secondary | ICD-10-CM | POA: Diagnosis not present

## 2015-12-25 DIAGNOSIS — J3089 Other allergic rhinitis: Secondary | ICD-10-CM | POA: Diagnosis not present

## 2015-12-28 MED FILL — FLUTICASONE PROP 50 MCG SPR: 50 | 30 days supply | Qty: 16 | Fill #0

## 2015-12-31 NOTE — Progress Notes (Signed)
I have reviewed this pharmacist's note and agree  

## 2016-01-01 DIAGNOSIS — J3089 Other allergic rhinitis: Secondary | ICD-10-CM | POA: Diagnosis not present

## 2016-01-01 DIAGNOSIS — J301 Allergic rhinitis due to pollen: Secondary | ICD-10-CM | POA: Diagnosis not present

## 2016-01-01 DIAGNOSIS — J3081 Allergic rhinitis due to animal (cat) (dog) hair and dander: Secondary | ICD-10-CM | POA: Diagnosis not present

## 2016-01-05 MED FILL — LISINOPRIL-HCTZ 10-12.5 MG: 10-12.5 | 90 days supply | Qty: 90 | Fill #1

## 2016-01-05 MED FILL — MONTELUKAST SOD 10 MG TAB: 10 | 90 days supply | Qty: 90 | Fill #1

## 2016-01-08 DIAGNOSIS — J301 Allergic rhinitis due to pollen: Secondary | ICD-10-CM | POA: Diagnosis not present

## 2016-01-08 DIAGNOSIS — J3089 Other allergic rhinitis: Secondary | ICD-10-CM | POA: Diagnosis not present

## 2016-01-08 DIAGNOSIS — J3081 Allergic rhinitis due to animal (cat) (dog) hair and dander: Secondary | ICD-10-CM | POA: Diagnosis not present

## 2016-01-15 DIAGNOSIS — J3089 Other allergic rhinitis: Secondary | ICD-10-CM | POA: Diagnosis not present

## 2016-01-15 DIAGNOSIS — J301 Allergic rhinitis due to pollen: Secondary | ICD-10-CM | POA: Diagnosis not present

## 2016-01-15 DIAGNOSIS — I10 Essential (primary) hypertension: Secondary | ICD-10-CM | POA: Diagnosis not present

## 2016-01-15 DIAGNOSIS — H401131 Primary open-angle glaucoma, bilateral, mild stage: Secondary | ICD-10-CM | POA: Diagnosis not present

## 2016-01-15 DIAGNOSIS — J3081 Allergic rhinitis due to animal (cat) (dog) hair and dander: Secondary | ICD-10-CM | POA: Diagnosis not present

## 2016-01-15 DIAGNOSIS — H35033 Hypertensive retinopathy, bilateral: Secondary | ICD-10-CM | POA: Diagnosis not present

## 2016-01-15 DIAGNOSIS — E119 Type 2 diabetes mellitus without complications: Secondary | ICD-10-CM | POA: Diagnosis not present

## 2016-01-20 DIAGNOSIS — I10 Essential (primary) hypertension: Secondary | ICD-10-CM | POA: Diagnosis not present

## 2016-01-20 DIAGNOSIS — E7800 Pure hypercholesterolemia, unspecified: Secondary | ICD-10-CM | POA: Diagnosis not present

## 2016-01-20 DIAGNOSIS — R7301 Impaired fasting glucose: Secondary | ICD-10-CM | POA: Diagnosis not present

## 2016-01-20 DIAGNOSIS — Z23 Encounter for immunization: Secondary | ICD-10-CM | POA: Diagnosis not present

## 2016-01-21 DIAGNOSIS — J301 Allergic rhinitis due to pollen: Secondary | ICD-10-CM | POA: Diagnosis not present

## 2016-01-21 DIAGNOSIS — J3089 Other allergic rhinitis: Secondary | ICD-10-CM | POA: Diagnosis not present

## 2016-01-21 DIAGNOSIS — J3081 Allergic rhinitis due to animal (cat) (dog) hair and dander: Secondary | ICD-10-CM | POA: Diagnosis not present

## 2016-01-26 DIAGNOSIS — J3081 Allergic rhinitis due to animal (cat) (dog) hair and dander: Secondary | ICD-10-CM | POA: Diagnosis not present

## 2016-01-26 DIAGNOSIS — J301 Allergic rhinitis due to pollen: Secondary | ICD-10-CM | POA: Diagnosis not present

## 2016-01-26 DIAGNOSIS — J3089 Other allergic rhinitis: Secondary | ICD-10-CM | POA: Diagnosis not present

## 2016-01-29 DIAGNOSIS — H52222 Regular astigmatism, left eye: Secondary | ICD-10-CM | POA: Diagnosis not present

## 2016-01-29 DIAGNOSIS — H524 Presbyopia: Secondary | ICD-10-CM | POA: Diagnosis not present

## 2016-01-29 DIAGNOSIS — H5213 Myopia, bilateral: Secondary | ICD-10-CM | POA: Diagnosis not present

## 2016-02-01 MED FILL — PAZEO 0.7% EYE DROPS: 0.7 | 30 days supply | Qty: 3 | Fill #0

## 2016-02-05 DIAGNOSIS — J3081 Allergic rhinitis due to animal (cat) (dog) hair and dander: Secondary | ICD-10-CM | POA: Diagnosis not present

## 2016-02-05 DIAGNOSIS — J3089 Other allergic rhinitis: Secondary | ICD-10-CM | POA: Diagnosis not present

## 2016-02-05 DIAGNOSIS — J301 Allergic rhinitis due to pollen: Secondary | ICD-10-CM | POA: Diagnosis not present

## 2016-02-12 ENCOUNTER — Ambulatory Visit (AMBULATORY_SURGERY_CENTER): Payer: Self-pay | Admitting: *Deleted

## 2016-02-12 VITALS — Ht 62.0 in | Wt 164.2 lb

## 2016-02-12 DIAGNOSIS — Z86010 Personal history of colonic polyps: Secondary | ICD-10-CM

## 2016-02-12 MED ORDER — SUPREP BOWEL PREP KIT 17.5-3.13-1.6 GM/177ML PO SOLN: 1.0000 | Freq: Once | ORAL | Status: DC

## 2016-02-12 NOTE — Progress Notes (Signed)
Patient denies any allergies to egg or soy products. Patient denies complications with anesthesia/sedation.  Patient denies oxygen use at home and denies diet medications. Emmi instructions for colonoscopy explained but patient denied.     

## 2016-02-17 DIAGNOSIS — J3081 Allergic rhinitis due to animal (cat) (dog) hair and dander: Secondary | ICD-10-CM | POA: Diagnosis not present

## 2016-02-17 DIAGNOSIS — J3089 Other allergic rhinitis: Secondary | ICD-10-CM | POA: Diagnosis not present

## 2016-02-17 DIAGNOSIS — J301 Allergic rhinitis due to pollen: Secondary | ICD-10-CM | POA: Diagnosis not present

## 2016-02-18 MED FILL — SUPREP BOWEL PREP KIT: 17.5-3.13-1 | 1 days supply | Qty: 354 | Fill #0

## 2016-02-25 DIAGNOSIS — J3089 Other allergic rhinitis: Secondary | ICD-10-CM | POA: Diagnosis not present

## 2016-02-25 DIAGNOSIS — J301 Allergic rhinitis due to pollen: Secondary | ICD-10-CM | POA: Diagnosis not present

## 2016-02-25 DIAGNOSIS — J3081 Allergic rhinitis due to animal (cat) (dog) hair and dander: Secondary | ICD-10-CM | POA: Diagnosis not present

## 2016-02-26 ENCOUNTER — Ambulatory Visit (AMBULATORY_SURGERY_CENTER): Payer: 59 | Admitting: Gastroenterology

## 2016-02-26 ENCOUNTER — Encounter: Payer: Self-pay | Admitting: Gastroenterology

## 2016-02-26 VITALS — BP 126/71 | HR 65 | Temp 97.8°F | Resp 27 | Ht 62.0 in | Wt 164.0 lb

## 2016-02-26 DIAGNOSIS — Z86010 Personal history of colonic polyps: Secondary | ICD-10-CM

## 2016-02-26 DIAGNOSIS — J45909 Unspecified asthma, uncomplicated: Secondary | ICD-10-CM | POA: Diagnosis not present

## 2016-02-26 DIAGNOSIS — D123 Benign neoplasm of transverse colon: Secondary | ICD-10-CM | POA: Diagnosis not present

## 2016-02-26 DIAGNOSIS — I10 Essential (primary) hypertension: Secondary | ICD-10-CM | POA: Diagnosis not present

## 2016-02-26 MED ORDER — SODIUM CHLORIDE 0.9 % IV SOLN: 500.0000 mL | INTRAVENOUS | Status: DC

## 2016-02-26 NOTE — Progress Notes (Signed)
Report to PACU, RN, vss, BBS= Clear.  

## 2016-02-26 NOTE — Patient Instructions (Signed)
Discharge instructions given. Handouts on polyps and diverticulosis. Resume previous medications. YOU HAD AN ENDOSCOPIC PROCEDURE TODAY AT THE [facility]:   Refer to the procedure report that was given to you for any specific questions about what was found during the examination.  If the procedure report does not answer your questions, please call your gastroenterologist to clarify.  If you requested that your care partner not be given the details of your procedure findings, then the procedure report has been included in a sealed envelope for you to review at your convenience later.  YOU SHOULD EXPECT: Some feelings of bloating in the abdomen. Passage of more gas than usual.  Walking can help get rid of the air that was put into your GI tract during the procedure and reduce the bloating. If you had a lower endoscopy (such as a colonoscopy or flexible sigmoidoscopy) you may notice spotting of blood in your stool or on the toilet paper. If you underwent a bowel prep for your procedure, you may not have a normal bowel movement for a few days.  Please Note:  You might notice some irritation and congestion in your nose or some drainage.  This is from the oxygen used during your procedure.  There is no need for concern and it should clear up in a day or so.  SYMPTOMS TO REPORT IMMEDIATELY:   Following lower endoscopy (colonoscopy or flexible sigmoidoscopy):  Excessive amounts of blood in the stool  Significant tenderness or worsening of abdominal pains  Swelling of the abdomen that is new, acute  Fever of 100F or higher   For urgent or emergent issues, a gastroenterologist can be reached at any hour by calling [phone].   DIET: Your first meal following the procedure should be a small meal and then it is ok to progress to your normal diet. Heavy or fried foods are harder to digest and may make you feel nauseous or bloated.  Likewise, meals heavy in dairy and vegetables can  increase bloating.  Drink plenty of fluids but you should avoid alcoholic beverages for 24 hours.  ACTIVITY:  You should plan to take it easy for the rest of today and you should NOT DRIVE or use heavy machinery until tomorrow (because of the sedation medicines used during the test).    FOLLOW UP: Our staff will call the number listed on your records the next business day following your procedure to check on you and address any questions or concerns that you may have regarding the information given to you following your procedure. If we do not reach you, we will leave a message.  However, if you are feeling well and you are not experiencing any problems, there is no need to return our call.  We will assume that you have returned to your regular daily activities without incident.  If any biopsies were taken you will be contacted by phone or by letter within the next 1-3 weeks.  Please call us at [phone] if you have not heard about the biopsies in 3 weeks.    SIGNATURES/CONFIDENTIALITY: You and/or your care partner have signed paperwork which will be entered into your electronic medical record.  These signatures attest to the fact that that the information above on your After Visit Summary has been reviewed and is understood.  Full responsibility of the confidentiality of this discharge information lies with you and/or your care-partner. 

## 2016-02-26 NOTE — Op Note (Signed)
[facility] Patient Name: Kristina Mckinney Procedure Date: 02/26/2016 9:08 AM MRN: [id number] Endoscopist: Milus Banister , MD Age: 60 Date of Birth: Dec 09, 1955 Gender: Female Procedure:                Colonoscopy Indications:              High risk colon cancer surveillance: Personal                            history of colonic , Shalamar Crays TVA removed colonoscopy                            2006, 2009, 2010. 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 Recall colonoscopy recommended at                            3 year interval. Medicines:                Monitored Anesthesia Care Procedure:                Pre-Anesthesia Assessment:                           - Prior to the procedure, a History and Physical                            was performed, and patient medications and                            allergies were reviewed. The patient's tolerance of                            previous anesthesia was also reviewed. The risks                            and benefits of the procedure and the sedation                            options and risks were discussed with the patient.  All questions were answered, and informed consent                            was obtained. Prior Anticoagulants: The patient has                            taken no previous anticoagulant or antiplatelet                            agents. ASA Grade Assessment: II - A patient with                            mild systemic disease. After reviewing the risks                            and benefits, the patient was deemed in   satisfactory condition to undergo the procedure.                           After obtaining informed consent, the colonoscope                            was passed under direct vision. Throughout the                            procedure, the patient's blood pressure, pulse, and                            oxygen saturations were monitored continuously. The                            Model CF-HQ190L [id number]) scope was introduced                            through the anus and advanced to the the cecum,                            identified by appendiceal orifice and ileocecal                            valve. The colonoscopy was performed without                            difficulty. The patient tolerated the procedure                            well. The quality of the bowel preparation was                            good. The ileocecal valve, appendiceal orifice, and                            rectum were photographed. Scope In: 9:13:37 AM Scope Out: 9:28:42 AM Scope Withdrawal Time: 0 hours 8  minutes 22 seconds  Total Procedure Duration: 0 hours 15 minutes 5 seconds  Findings:                 A 3 mm polyp was found in the transverse colon. The                            polyp was sessile. The polyp was removed with a                            cold snare. Resection and retrieval were complete.                           Many small and large-mouthed diverticula were found                            in the left colon.                           The exam was otherwise without abnormality on                            direct and retroflexion views. The site of hepatic                            flexure tattoo from previous polypectomy was normal                            appearing. Complications:            No immediate complications. Estimated blood loss:                            None. Estimated Blood Loss:     Estimated blood loss: none. Impression:               - One 3 mm polyp in  the transverse colon, removed                            with a cold snare. Resected and retrieved.                           - Diverticulosis in the left colon.                           - The examination was otherwise normal on direct                            and retroflexion views. Recommendation:           - Patient has a contact number available for                            emergencies. The signs and symptoms of potential                            delayed  complications were discussed with the                            patient. Return to normal activities tomorrow.                            Written discharge instructions were provided to the                            patient.                           - Resume previous diet.                           - Continue present medications.                           You will receive a letter within 2-3 weeks with the                            pathology results and my final recommendations.                           If the polyp(s) is proven to be 'pre-cancerous' on                            pathology, you will need repeat colonoscopy in 5                            years. Milus Banister, MD 02/26/2016 9:35:21 AM This report has been signed electronically.

## 2016-02-26 NOTE — Progress Notes (Signed)
Called to room to assist during endoscopic procedure.  Patient ID and intended procedure confirmed with present staff. Received instructions for my participation in the procedure from the performing physician.  

## 2016-02-29 ENCOUNTER — Telehealth: Payer: Self-pay

## 2016-02-29 NOTE — Telephone Encounter (Signed)
Left message on answering machine. 

## 2016-03-02 MED FILL — ADVAIR 100/50 DISKUS: 100-50 | 90 days supply | Qty: 180 | Fill #0

## 2016-03-03 ENCOUNTER — Encounter: Payer: Self-pay | Admitting: Gastroenterology

## 2016-03-04 DIAGNOSIS — J3089 Other allergic rhinitis: Secondary | ICD-10-CM | POA: Diagnosis not present

## 2016-03-04 DIAGNOSIS — J301 Allergic rhinitis due to pollen: Secondary | ICD-10-CM | POA: Diagnosis not present

## 2016-03-04 DIAGNOSIS — J3081 Allergic rhinitis due to animal (cat) (dog) hair and dander: Secondary | ICD-10-CM | POA: Diagnosis not present

## 2016-03-08 DIAGNOSIS — J301 Allergic rhinitis due to pollen: Secondary | ICD-10-CM | POA: Diagnosis not present

## 2016-03-08 DIAGNOSIS — J3089 Other allergic rhinitis: Secondary | ICD-10-CM | POA: Diagnosis not present

## 2016-03-08 DIAGNOSIS — J3081 Allergic rhinitis due to animal (cat) (dog) hair and dander: Secondary | ICD-10-CM | POA: Diagnosis not present

## 2016-03-14 ENCOUNTER — Encounter: Payer: Self-pay | Admitting: Gastroenterology

## 2016-03-14 DIAGNOSIS — J3089 Other allergic rhinitis: Secondary | ICD-10-CM | POA: Diagnosis not present

## 2016-03-14 DIAGNOSIS — J301 Allergic rhinitis due to pollen: Secondary | ICD-10-CM | POA: Diagnosis not present

## 2016-03-14 DIAGNOSIS — J3081 Allergic rhinitis due to animal (cat) (dog) hair and dander: Secondary | ICD-10-CM | POA: Diagnosis not present

## 2016-03-14 DIAGNOSIS — J4530 Mild persistent asthma, uncomplicated: Secondary | ICD-10-CM | POA: Diagnosis not present

## 2016-03-14 MED FILL — AZELASTINE HCL 137 MCG SPRY: 0.1 | 50 days supply | Qty: 30 | Fill #0

## 2016-03-16 MED FILL — TRUE METRIX GLUCOSE TEST ST: 90 days supply | Qty: 100 | Fill #1

## 2016-03-16 MED FILL — SM ALCOHOL 70% PREP PADS: 30 days supply | Qty: 100 | Fill #1

## 2016-03-18 ENCOUNTER — Ambulatory Visit: Payer: Self-pay | Admitting: Pharmacist

## 2016-03-18 MED FILL — DESLORATADINE 5 MG TABLET: 5 | 90 days supply | Qty: 90 | Fill #0

## 2016-03-21 MED FILL — POLYETHYLENE GLYCOL 3350: 15 days supply | Qty: 255 | Fill #2

## 2016-03-22 MED FILL — FLUTICASONE PROP 50 MCG SPR: 50 | 30 days supply | Qty: 16 | Fill #1

## 2016-03-25 ENCOUNTER — Other Ambulatory Visit: Payer: Self-pay | Admitting: Pharmacist

## 2016-03-25 ENCOUNTER — Encounter: Payer: Self-pay | Admitting: Pharmacist

## 2016-03-25 VITALS — BP 128/76 | Wt 163.0 lb

## 2016-03-25 DIAGNOSIS — E119 Type 2 diabetes mellitus without complications: Secondary | ICD-10-CM

## 2016-03-25 DIAGNOSIS — J3081 Allergic rhinitis due to animal (cat) (dog) hair and dander: Secondary | ICD-10-CM | POA: Diagnosis not present

## 2016-03-25 DIAGNOSIS — J301 Allergic rhinitis due to pollen: Secondary | ICD-10-CM | POA: Diagnosis not present

## 2016-03-25 DIAGNOSIS — J3089 Other allergic rhinitis: Secondary | ICD-10-CM | POA: Diagnosis not present

## 2016-03-25 NOTE — Patient Outreach (Signed)
Subjective:  Patient presents today for 3 month diabetes follow-up as part of the employer-sponsored Link to Wellness program.  Current diabetes regimen includes no medication. Patient also continues on daily  ACE Inhibitor.  Most recent MD follow-up was with Dr. Alroy Dust in March. Patient is unsure of what her A1C was at that visit. (A1C was 6.8% in February)  Patient has a pending appt for September with Dr. Alroy Dust. No major health changes at this time.   Medication changes- started azelastine 0.1% 2 sprays in each nostril BID, omnaris was changed to flonase.   Assessment:  Diabetes: Most recent A1C was 6.8  % which is at goal of less than 7%. Weight is stable from last visit with me.    CBG Review: Patient is checking CBG once daily. CBG averages are similar to last visit. Fasting CBG is running 110-120.  Denies hypoglycemia.   High/Low- 132/106  Lifestyle improvements:  Physical Activity-  Patient is doing 30 minutes on her stationary bike every day. She is going to the employee gym 10-15 minutes on the elliptical and she is also doing weight training three days a week. 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   Nutrition-  She has been snacking on apples instead of concentrated sweets. Sometimes she will eat PB with the apples and crackers. She is still doing weight watchers but she is not sure if she is going to continue with it. She would like to go back to My [employer]itness Pal to track her intake.    Follow up with me in 3 months. I will fax to Dr. Virgilio Belling office to get the most recent A1C result.    Plan/Goals for Next Visit:  1. Continue physical activity daily. Keep up with the strengthening exercises and weight lifting.  2. Continue to track your food intake, whether it is with weight watchers or with My Fitness Pal.     Next appointment to see me is: Friday August 18th at 11 AM.    Truett Mainland. Donneta Romberg, PharmD, BCPS,  CDE Norm Parcel to [place] Coordinator [phone]

## 2016-04-05 MED FILL — SM ALCOHOL 70% PREP PADS: 70 | 30 days supply | Qty: 100 | Fill #0

## 2016-04-05 MED FILL — LISINOPRIL-HCTZ 10-12.5 MG: 10-12.5 | 90 days supply | Qty: 90 | Fill #0

## 2016-04-08 DIAGNOSIS — J301 Allergic rhinitis due to pollen: Secondary | ICD-10-CM | POA: Diagnosis not present

## 2016-04-08 DIAGNOSIS — J3081 Allergic rhinitis due to animal (cat) (dog) hair and dander: Secondary | ICD-10-CM | POA: Diagnosis not present

## 2016-04-08 DIAGNOSIS — J3089 Other allergic rhinitis: Secondary | ICD-10-CM | POA: Diagnosis not present

## 2016-04-11 MED FILL — CLINDAMYCIN HCL 300 MG CAP: 300 | 7 days supply | Qty: 28 | Fill #0

## 2016-04-11 MED FILL — MONTELUKAST SOD 10 MG TAB: 10 | 90 days supply | Qty: 90 | Fill #2

## 2016-04-15 DIAGNOSIS — J3089 Other allergic rhinitis: Secondary | ICD-10-CM | POA: Diagnosis not present

## 2016-04-15 DIAGNOSIS — J3081 Allergic rhinitis due to animal (cat) (dog) hair and dander: Secondary | ICD-10-CM | POA: Diagnosis not present

## 2016-04-15 DIAGNOSIS — J301 Allergic rhinitis due to pollen: Secondary | ICD-10-CM | POA: Diagnosis not present

## 2016-04-22 DIAGNOSIS — J301 Allergic rhinitis due to pollen: Secondary | ICD-10-CM | POA: Diagnosis not present

## 2016-04-22 DIAGNOSIS — J3089 Other allergic rhinitis: Secondary | ICD-10-CM | POA: Diagnosis not present

## 2016-04-22 DIAGNOSIS — J3081 Allergic rhinitis due to animal (cat) (dog) hair and dander: Secondary | ICD-10-CM | POA: Diagnosis not present

## 2016-04-25 MED FILL — CLINDAMYCIN HCL 300 MG CAPS: 300 | 7 days supply | Qty: 28 | Fill #0

## 2016-04-29 DIAGNOSIS — J3081 Allergic rhinitis due to animal (cat) (dog) hair and dander: Secondary | ICD-10-CM | POA: Diagnosis not present

## 2016-04-29 DIAGNOSIS — J301 Allergic rhinitis due to pollen: Secondary | ICD-10-CM | POA: Diagnosis not present

## 2016-04-29 DIAGNOSIS — J3089 Other allergic rhinitis: Secondary | ICD-10-CM | POA: Diagnosis not present

## 2016-05-06 DIAGNOSIS — J3081 Allergic rhinitis due to animal (cat) (dog) hair and dander: Secondary | ICD-10-CM | POA: Diagnosis not present

## 2016-05-06 DIAGNOSIS — J3089 Other allergic rhinitis: Secondary | ICD-10-CM | POA: Diagnosis not present

## 2016-05-06 DIAGNOSIS — J301 Allergic rhinitis due to pollen: Secondary | ICD-10-CM | POA: Diagnosis not present

## 2016-05-13 DIAGNOSIS — J3081 Allergic rhinitis due to animal (cat) (dog) hair and dander: Secondary | ICD-10-CM | POA: Diagnosis not present

## 2016-05-13 DIAGNOSIS — J3089 Other allergic rhinitis: Secondary | ICD-10-CM | POA: Diagnosis not present

## 2016-05-13 DIAGNOSIS — J301 Allergic rhinitis due to pollen: Secondary | ICD-10-CM | POA: Diagnosis not present

## 2016-05-19 MED FILL — CLINDAMYCIN HCL 300 MG CAPS: 300 | 7 days supply | Qty: 28 | Fill #0

## 2016-05-20 DIAGNOSIS — J3081 Allergic rhinitis due to animal (cat) (dog) hair and dander: Secondary | ICD-10-CM | POA: Diagnosis not present

## 2016-05-20 DIAGNOSIS — J301 Allergic rhinitis due to pollen: Secondary | ICD-10-CM | POA: Diagnosis not present

## 2016-05-20 DIAGNOSIS — J3089 Other allergic rhinitis: Secondary | ICD-10-CM | POA: Diagnosis not present

## 2016-05-26 DIAGNOSIS — H40233 Intermittent angle-closure glaucoma, bilateral: Secondary | ICD-10-CM | POA: Diagnosis not present

## 2016-05-26 MED FILL — TRAVATAN Z 0.004% EYE DROP: 0.004 | 90 days supply | Qty: 8 | Fill #0

## 2016-05-27 DIAGNOSIS — J301 Allergic rhinitis due to pollen: Secondary | ICD-10-CM | POA: Diagnosis not present

## 2016-05-27 DIAGNOSIS — J3081 Allergic rhinitis due to animal (cat) (dog) hair and dander: Secondary | ICD-10-CM | POA: Diagnosis not present

## 2016-05-27 DIAGNOSIS — J3089 Other allergic rhinitis: Secondary | ICD-10-CM | POA: Diagnosis not present

## 2016-05-31 MED FILL — AZITHROMYCIN 250 MG TABLET: 250 | 5 days supply | Qty: 6 | Fill #0

## 2016-06-01 MED FILL — TRUEplus LANCETS 30G MISC: 90 days supply | Qty: 100 | Fill #0

## 2016-06-03 DIAGNOSIS — J3081 Allergic rhinitis due to animal (cat) (dog) hair and dander: Secondary | ICD-10-CM | POA: Diagnosis not present

## 2016-06-03 DIAGNOSIS — J301 Allergic rhinitis due to pollen: Secondary | ICD-10-CM | POA: Diagnosis not present

## 2016-06-03 DIAGNOSIS — J3089 Other allergic rhinitis: Secondary | ICD-10-CM | POA: Diagnosis not present

## 2016-06-03 MED FILL — AZELASTINE HCL 137 MCG SPRY: 0.1 | 50 days supply | Qty: 30 | Fill #1

## 2016-06-03 MED FILL — FLUTICASONE PROP 50 MCG SPR: 50 | 30 days supply | Qty: 16 | Fill #2

## 2016-06-03 MED FILL — ADVAIR 100/50 DISKUS: 100-50 | 90 days supply | Qty: 180 | Fill #0

## 2016-06-10 DIAGNOSIS — J3089 Other allergic rhinitis: Secondary | ICD-10-CM | POA: Diagnosis not present

## 2016-06-10 DIAGNOSIS — J301 Allergic rhinitis due to pollen: Secondary | ICD-10-CM | POA: Diagnosis not present

## 2016-06-10 DIAGNOSIS — J3081 Allergic rhinitis due to animal (cat) (dog) hair and dander: Secondary | ICD-10-CM | POA: Diagnosis not present

## 2016-06-17 DIAGNOSIS — J301 Allergic rhinitis due to pollen: Secondary | ICD-10-CM | POA: Diagnosis not present

## 2016-06-17 DIAGNOSIS — J3081 Allergic rhinitis due to animal (cat) (dog) hair and dander: Secondary | ICD-10-CM | POA: Diagnosis not present

## 2016-06-17 DIAGNOSIS — J3089 Other allergic rhinitis: Secondary | ICD-10-CM | POA: Diagnosis not present

## 2016-06-24 ENCOUNTER — Other Ambulatory Visit: Payer: Self-pay | Admitting: Pharmacist

## 2016-06-24 VITALS — BP 120/76 | Wt 166.4 lb

## 2016-06-24 DIAGNOSIS — J3089 Other allergic rhinitis: Secondary | ICD-10-CM | POA: Diagnosis not present

## 2016-06-24 DIAGNOSIS — E119 Type 2 diabetes mellitus without complications: Secondary | ICD-10-CM

## 2016-06-24 DIAGNOSIS — J301 Allergic rhinitis due to pollen: Secondary | ICD-10-CM | POA: Diagnosis not present

## 2016-06-24 DIAGNOSIS — J3081 Allergic rhinitis due to animal (cat) (dog) hair and dander: Secondary | ICD-10-CM | POA: Diagnosis not present

## 2016-06-24 MED FILL — LISINOPRIL-HCTZ 10-12.5 MG: 10-12.5 | 90 days supply | Qty: 90 | Fill #1

## 2016-06-24 MED FILL — DESLORATADINE 5 MG TABLET: 5 | 90 days supply | Qty: 90 | Fill #1

## 2016-06-24 NOTE — Patient Outreach (Signed)
Subjective:  Patient presents today for 3 month diabetes follow-up as part of the employer-sponsored Link to Wellness program.  Current diabetes regimen includes no medications for diabetes. Patient also continues on daily ACE Inhibitor.  Most recent MD follow-up was in March with Dr. Alroy Dust. A1C at that point was 6.8%.  Patient has a pending appt for Dr. Alroy Dust in September.  No med changes or major health changes at this time.     Assessment:  Diabetes: Most recent A1C was 6.8% which is at goal of less than 7%. Weight is stable from last visit with me.    CBG Review: She is checking once daily fasting, Monday- Friday. CBG averages are very similar to last visit and are almost identical.   No hypoglycemia. High/Low- 124/85.   Because CBGs are so similar and patient has an appointment with Dr. Alroy Dust within the month I will defer A1C testing today.   Lifestyle improvements:  Physical Activity-  She is still riding the stationary bike 30 minutes daily. 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    Nutrition-  Patient states that she has been eating more sweets lately and she hasn't been doing as well with meal planning. She is also eating M&Ms at work.     Follow up with me in 5 months when I return from maternity leave.    Plan/Goals for Next Visit:  1. Now that the girls are back in school get back into the habit of meal planning and tracking your food intake into My Fitness Pal.  2. Limit sweet intake to twice a week.  3. Limit M&Ms to one small handful daily.     Next appointment to see me is: Friday January 26th at 1:30 PM.    Truett Mainland. Donneta Romberg, PharmD, BCPS, CDE Norm Parcel to [place] Coordinator [phone]

## 2016-07-01 DIAGNOSIS — J3081 Allergic rhinitis due to animal (cat) (dog) hair and dander: Secondary | ICD-10-CM | POA: Diagnosis not present

## 2016-07-01 DIAGNOSIS — J301 Allergic rhinitis due to pollen: Secondary | ICD-10-CM | POA: Diagnosis not present

## 2016-07-01 DIAGNOSIS — J3089 Other allergic rhinitis: Secondary | ICD-10-CM | POA: Diagnosis not present

## 2016-07-08 DIAGNOSIS — J3081 Allergic rhinitis due to animal (cat) (dog) hair and dander: Secondary | ICD-10-CM | POA: Diagnosis not present

## 2016-07-08 DIAGNOSIS — J301 Allergic rhinitis due to pollen: Secondary | ICD-10-CM | POA: Diagnosis not present

## 2016-07-08 DIAGNOSIS — J3089 Other allergic rhinitis: Secondary | ICD-10-CM | POA: Diagnosis not present

## 2016-07-15 DIAGNOSIS — J3089 Other allergic rhinitis: Secondary | ICD-10-CM | POA: Diagnosis not present

## 2016-07-15 DIAGNOSIS — J3081 Allergic rhinitis due to animal (cat) (dog) hair and dander: Secondary | ICD-10-CM | POA: Diagnosis not present

## 2016-07-15 DIAGNOSIS — J301 Allergic rhinitis due to pollen: Secondary | ICD-10-CM | POA: Diagnosis not present

## 2016-07-22 DIAGNOSIS — J3089 Other allergic rhinitis: Secondary | ICD-10-CM | POA: Diagnosis not present

## 2016-07-22 DIAGNOSIS — J3081 Allergic rhinitis due to animal (cat) (dog) hair and dander: Secondary | ICD-10-CM | POA: Diagnosis not present

## 2016-07-22 DIAGNOSIS — J301 Allergic rhinitis due to pollen: Secondary | ICD-10-CM | POA: Diagnosis not present

## 2016-07-26 DIAGNOSIS — Z0000 Encounter for general adult medical examination without abnormal findings: Secondary | ICD-10-CM | POA: Diagnosis not present

## 2016-07-26 DIAGNOSIS — R7301 Impaired fasting glucose: Secondary | ICD-10-CM | POA: Diagnosis not present

## 2016-07-26 DIAGNOSIS — Z23 Encounter for immunization: Secondary | ICD-10-CM | POA: Diagnosis not present

## 2016-07-26 DIAGNOSIS — I10 Essential (primary) hypertension: Secondary | ICD-10-CM | POA: Diagnosis not present

## 2016-07-26 DIAGNOSIS — E7800 Pure hypercholesterolemia, unspecified: Secondary | ICD-10-CM | POA: Diagnosis not present

## 2016-07-29 DIAGNOSIS — J3089 Other allergic rhinitis: Secondary | ICD-10-CM | POA: Diagnosis not present

## 2016-07-29 DIAGNOSIS — J301 Allergic rhinitis due to pollen: Secondary | ICD-10-CM | POA: Diagnosis not present

## 2016-07-29 DIAGNOSIS — J3081 Allergic rhinitis due to animal (cat) (dog) hair and dander: Secondary | ICD-10-CM | POA: Diagnosis not present

## 2016-07-29 MED FILL — POLYETHYLENE GLYCOL 3350: 15 days supply | Qty: 255 | Fill #3

## 2016-08-01 MED FILL — MONTELUKAST SOD 10 MG TAB: 10 | 90 days supply | Qty: 90 | Fill #0

## 2016-08-04 ENCOUNTER — Other Ambulatory Visit: Payer: Self-pay | Admitting: Family Medicine

## 2016-08-04 DIAGNOSIS — Z1231 Encounter for screening mammogram for malignant neoplasm of breast: Secondary | ICD-10-CM

## 2016-08-12 DIAGNOSIS — J3081 Allergic rhinitis due to animal (cat) (dog) hair and dander: Secondary | ICD-10-CM | POA: Diagnosis not present

## 2016-08-12 DIAGNOSIS — J3089 Other allergic rhinitis: Secondary | ICD-10-CM | POA: Diagnosis not present

## 2016-08-12 DIAGNOSIS — J301 Allergic rhinitis due to pollen: Secondary | ICD-10-CM | POA: Diagnosis not present

## 2016-08-19 DIAGNOSIS — J3089 Other allergic rhinitis: Secondary | ICD-10-CM | POA: Diagnosis not present

## 2016-08-19 DIAGNOSIS — J3081 Allergic rhinitis due to animal (cat) (dog) hair and dander: Secondary | ICD-10-CM | POA: Diagnosis not present

## 2016-08-19 DIAGNOSIS — J301 Allergic rhinitis due to pollen: Secondary | ICD-10-CM | POA: Diagnosis not present

## 2016-08-26 DIAGNOSIS — J301 Allergic rhinitis due to pollen: Secondary | ICD-10-CM | POA: Diagnosis not present

## 2016-08-26 DIAGNOSIS — J3081 Allergic rhinitis due to animal (cat) (dog) hair and dander: Secondary | ICD-10-CM | POA: Diagnosis not present

## 2016-08-26 DIAGNOSIS — J3089 Other allergic rhinitis: Secondary | ICD-10-CM | POA: Diagnosis not present

## 2016-08-26 MED FILL — FLUTICASONE PROP 50 MCG SPR: 50 | 30 days supply | Qty: 16 | Fill #3

## 2016-08-30 MED FILL — TRAVATAN Z 0.004% EYE DROP: 0.004 | 90 days supply | Qty: 8 | Fill #1

## 2016-09-02 DIAGNOSIS — J3081 Allergic rhinitis due to animal (cat) (dog) hair and dander: Secondary | ICD-10-CM | POA: Diagnosis not present

## 2016-09-02 DIAGNOSIS — J301 Allergic rhinitis due to pollen: Secondary | ICD-10-CM | POA: Diagnosis not present

## 2016-09-02 DIAGNOSIS — J3089 Other allergic rhinitis: Secondary | ICD-10-CM | POA: Diagnosis not present

## 2016-09-06 DIAGNOSIS — J301 Allergic rhinitis due to pollen: Secondary | ICD-10-CM | POA: Diagnosis not present

## 2016-09-07 DIAGNOSIS — J3081 Allergic rhinitis due to animal (cat) (dog) hair and dander: Secondary | ICD-10-CM | POA: Diagnosis not present

## 2016-09-07 DIAGNOSIS — J3089 Other allergic rhinitis: Secondary | ICD-10-CM | POA: Diagnosis not present

## 2016-09-09 DIAGNOSIS — J3081 Allergic rhinitis due to animal (cat) (dog) hair and dander: Secondary | ICD-10-CM | POA: Diagnosis not present

## 2016-09-09 DIAGNOSIS — J3089 Other allergic rhinitis: Secondary | ICD-10-CM | POA: Diagnosis not present

## 2016-09-09 DIAGNOSIS — J301 Allergic rhinitis due to pollen: Secondary | ICD-10-CM | POA: Diagnosis not present

## 2016-09-16 DIAGNOSIS — J301 Allergic rhinitis due to pollen: Secondary | ICD-10-CM | POA: Diagnosis not present

## 2016-09-16 DIAGNOSIS — J3089 Other allergic rhinitis: Secondary | ICD-10-CM | POA: Diagnosis not present

## 2016-09-16 DIAGNOSIS — J3081 Allergic rhinitis due to animal (cat) (dog) hair and dander: Secondary | ICD-10-CM | POA: Diagnosis not present

## 2016-09-19 MED FILL — ADVAIR 100/50 DISKUS: 100-50 | 90 days supply | Qty: 180 | Fill #1

## 2016-09-19 MED FILL — AZELASTINE HCL 137 MCG SPRY: 0.1 | 50 days supply | Qty: 30 | Fill #2

## 2016-09-19 MED FILL — DESLORATADINE 5 MG TABLET: 5 | 90 days supply | Qty: 90 | Fill #2

## 2016-09-23 DIAGNOSIS — J3081 Allergic rhinitis due to animal (cat) (dog) hair and dander: Secondary | ICD-10-CM | POA: Diagnosis not present

## 2016-09-23 DIAGNOSIS — J301 Allergic rhinitis due to pollen: Secondary | ICD-10-CM | POA: Diagnosis not present

## 2016-09-23 DIAGNOSIS — J3089 Other allergic rhinitis: Secondary | ICD-10-CM | POA: Diagnosis not present

## 2016-10-04 MED FILL — AZITHROMYCIN 250 MG TABLET: 250 | 5 days supply | Qty: 6 | Fill #0

## 2016-10-04 MED FILL — CHLORHEXIDINE 0.12% RINSE: 0.12 | 16 days supply | Qty: 473 | Fill #0

## 2016-10-07 DIAGNOSIS — J3081 Allergic rhinitis due to animal (cat) (dog) hair and dander: Secondary | ICD-10-CM | POA: Diagnosis not present

## 2016-10-07 DIAGNOSIS — J301 Allergic rhinitis due to pollen: Secondary | ICD-10-CM | POA: Diagnosis not present

## 2016-10-07 DIAGNOSIS — J3089 Other allergic rhinitis: Secondary | ICD-10-CM | POA: Diagnosis not present

## 2016-10-10 ENCOUNTER — Ambulatory Visit: Payer: 59

## 2016-10-10 ENCOUNTER — Ambulatory Visit
Admission: RE | Admit: 2016-10-10 | Discharge: 2016-10-10 | Disposition: A | Discharge status: Home / Self Care | Payer: 59 | Source: Ambulatory Visit | Attending: Family Medicine | Admitting: Family Medicine

## 2016-10-10 DIAGNOSIS — Z1231 Encounter for screening mammogram for malignant neoplasm of breast: Secondary | ICD-10-CM

## 2016-10-14 DIAGNOSIS — J3089 Other allergic rhinitis: Secondary | ICD-10-CM | POA: Diagnosis not present

## 2016-10-14 DIAGNOSIS — J301 Allergic rhinitis due to pollen: Secondary | ICD-10-CM | POA: Diagnosis not present

## 2016-10-14 DIAGNOSIS — J3081 Allergic rhinitis due to animal (cat) (dog) hair and dander: Secondary | ICD-10-CM | POA: Diagnosis not present

## 2016-10-20 MED FILL — CLINDAMYCIN HCL 300 MG CAPS: 300 | 7 days supply | Qty: 28 | Fill #0

## 2016-10-21 DIAGNOSIS — J301 Allergic rhinitis due to pollen: Secondary | ICD-10-CM | POA: Diagnosis not present

## 2016-10-21 DIAGNOSIS — J3089 Other allergic rhinitis: Secondary | ICD-10-CM | POA: Diagnosis not present

## 2016-10-21 DIAGNOSIS — J3081 Allergic rhinitis due to animal (cat) (dog) hair and dander: Secondary | ICD-10-CM | POA: Diagnosis not present

## 2016-10-28 DIAGNOSIS — J3081 Allergic rhinitis due to animal (cat) (dog) hair and dander: Secondary | ICD-10-CM | POA: Diagnosis not present

## 2016-10-28 DIAGNOSIS — J3089 Other allergic rhinitis: Secondary | ICD-10-CM | POA: Diagnosis not present

## 2016-10-28 DIAGNOSIS — J301 Allergic rhinitis due to pollen: Secondary | ICD-10-CM | POA: Diagnosis not present

## 2016-11-08 MED FILL — LISINOPRIL-HCTZ 10-12.5 MG: 10-12.5 | 90 days supply | Qty: 90 | Fill #2

## 2016-11-08 MED FILL — MONTELUKAST SOD 10 MG TAB: 10 | 90 days supply | Qty: 90 | Fill #1

## 2016-11-11 DIAGNOSIS — J301 Allergic rhinitis due to pollen: Secondary | ICD-10-CM | POA: Diagnosis not present

## 2016-11-11 DIAGNOSIS — J3081 Allergic rhinitis due to animal (cat) (dog) hair and dander: Secondary | ICD-10-CM | POA: Diagnosis not present

## 2016-11-11 DIAGNOSIS — J3089 Other allergic rhinitis: Secondary | ICD-10-CM | POA: Diagnosis not present

## 2016-11-14 MED FILL — ACCU-CHEK GUIDE TEST STRIP: 90 days supply | Qty: 100 | Fill #0

## 2016-11-14 MED FILL — FLUTICASONE PROP 50 MCG SPR: 50 | 30 days supply | Qty: 16 | Fill #0

## 2016-11-18 DIAGNOSIS — J301 Allergic rhinitis due to pollen: Secondary | ICD-10-CM | POA: Diagnosis not present

## 2016-11-18 DIAGNOSIS — J3089 Other allergic rhinitis: Secondary | ICD-10-CM | POA: Diagnosis not present

## 2016-11-18 DIAGNOSIS — J3081 Allergic rhinitis due to animal (cat) (dog) hair and dander: Secondary | ICD-10-CM | POA: Diagnosis not present

## 2016-11-18 MED FILL — ACCU-CHEK FASTCLIX LANCETS: 90 days supply | Qty: 102 | Fill #0

## 2016-11-18 MED FILL — EPINEPHRINE 0.3 MG AUTO-INJ: 0.3 | 30 days supply | Qty: 2 | Fill #0

## 2016-11-21 MED FILL — TRAVATAN Z 0.004% EYE DROP: 0.004 | 90 days supply | Qty: 8 | Fill #2

## 2016-11-25 DIAGNOSIS — J3081 Allergic rhinitis due to animal (cat) (dog) hair and dander: Secondary | ICD-10-CM | POA: Diagnosis not present

## 2016-11-25 DIAGNOSIS — J3089 Other allergic rhinitis: Secondary | ICD-10-CM | POA: Diagnosis not present

## 2016-11-25 DIAGNOSIS — J301 Allergic rhinitis due to pollen: Secondary | ICD-10-CM | POA: Diagnosis not present

## 2016-12-02 ENCOUNTER — Ambulatory Visit: Payer: Self-pay | Admitting: Pharmacist

## 2016-12-02 DIAGNOSIS — J301 Allergic rhinitis due to pollen: Secondary | ICD-10-CM | POA: Diagnosis not present

## 2016-12-02 DIAGNOSIS — J3089 Other allergic rhinitis: Secondary | ICD-10-CM | POA: Diagnosis not present

## 2016-12-02 DIAGNOSIS — J3081 Allergic rhinitis due to animal (cat) (dog) hair and dander: Secondary | ICD-10-CM | POA: Diagnosis not present

## 2016-12-19 MED FILL — ADVAIR 100/50 DISKUS: 100-50 | 90 days supply | Qty: 180 | Fill #2

## 2016-12-19 MED FILL — AZELASTINE HCL 137 MCG SPRY: 0.1 | 50 days supply | Qty: 30 | Fill #3

## 2016-12-19 MED FILL — DESLORATADINE 5 MG TABLET: 5 | 90 days supply | Qty: 90 | Fill #0

## 2016-12-23 DIAGNOSIS — J3081 Allergic rhinitis due to animal (cat) (dog) hair and dander: Secondary | ICD-10-CM | POA: Diagnosis not present

## 2016-12-23 DIAGNOSIS — J301 Allergic rhinitis due to pollen: Secondary | ICD-10-CM | POA: Diagnosis not present

## 2016-12-23 DIAGNOSIS — J3089 Other allergic rhinitis: Secondary | ICD-10-CM | POA: Diagnosis not present

## 2017-01-06 DIAGNOSIS — J301 Allergic rhinitis due to pollen: Secondary | ICD-10-CM | POA: Diagnosis not present

## 2017-01-06 DIAGNOSIS — J3081 Allergic rhinitis due to animal (cat) (dog) hair and dander: Secondary | ICD-10-CM | POA: Diagnosis not present

## 2017-01-06 DIAGNOSIS — J3089 Other allergic rhinitis: Secondary | ICD-10-CM | POA: Diagnosis not present

## 2017-01-13 DIAGNOSIS — J3089 Other allergic rhinitis: Secondary | ICD-10-CM | POA: Diagnosis not present

## 2017-01-13 DIAGNOSIS — J3081 Allergic rhinitis due to animal (cat) (dog) hair and dander: Secondary | ICD-10-CM | POA: Diagnosis not present

## 2017-01-13 DIAGNOSIS — J301 Allergic rhinitis due to pollen: Secondary | ICD-10-CM | POA: Diagnosis not present

## 2017-01-20 DIAGNOSIS — J3089 Other allergic rhinitis: Secondary | ICD-10-CM | POA: Diagnosis not present

## 2017-01-20 DIAGNOSIS — J301 Allergic rhinitis due to pollen: Secondary | ICD-10-CM | POA: Diagnosis not present

## 2017-01-20 DIAGNOSIS — J3081 Allergic rhinitis due to animal (cat) (dog) hair and dander: Secondary | ICD-10-CM | POA: Diagnosis not present

## 2017-01-26 DIAGNOSIS — R7303 Prediabetes: Secondary | ICD-10-CM | POA: Diagnosis not present

## 2017-01-26 DIAGNOSIS — I10 Essential (primary) hypertension: Secondary | ICD-10-CM | POA: Diagnosis not present

## 2017-01-26 DIAGNOSIS — E7800 Pure hypercholesterolemia, unspecified: Secondary | ICD-10-CM | POA: Diagnosis not present

## 2017-01-27 DIAGNOSIS — H40013 Open angle with borderline findings, low risk, bilateral: Secondary | ICD-10-CM | POA: Diagnosis not present

## 2017-01-27 DIAGNOSIS — J3081 Allergic rhinitis due to animal (cat) (dog) hair and dander: Secondary | ICD-10-CM | POA: Diagnosis not present

## 2017-01-27 DIAGNOSIS — J301 Allergic rhinitis due to pollen: Secondary | ICD-10-CM | POA: Diagnosis not present

## 2017-01-27 DIAGNOSIS — J3089 Other allergic rhinitis: Secondary | ICD-10-CM | POA: Diagnosis not present

## 2017-02-01 MED FILL — MONTELUKAST SOD 10 MG TAB: 10 | 90 days supply | Qty: 90 | Fill #2

## 2017-02-01 MED FILL — LISINOPRIL-HCTZ 10-12.5 MG: 10-12.5 | 90 days supply | Qty: 90 | Fill #3

## 2017-02-01 MED FILL — FLUTICASONE PROP 50 MCG SPR: 50 | 30 days supply | Qty: 16 | Fill #1

## 2017-02-15 DIAGNOSIS — J301 Allergic rhinitis due to pollen: Secondary | ICD-10-CM | POA: Diagnosis not present

## 2017-02-15 DIAGNOSIS — J4530 Mild persistent asthma, uncomplicated: Secondary | ICD-10-CM | POA: Diagnosis not present

## 2017-02-15 DIAGNOSIS — J3089 Other allergic rhinitis: Secondary | ICD-10-CM | POA: Diagnosis not present

## 2017-02-15 DIAGNOSIS — J019 Acute sinusitis, unspecified: Secondary | ICD-10-CM | POA: Diagnosis not present

## 2017-02-15 MED FILL — AZITHROMYCIN 250 MG TABLET: 250 | 5 days supply | Qty: 6 | Fill #0

## 2017-02-23 MED FILL — AZITHROMYCIN 250 MG TABLET: 250 | 5 days supply | Qty: 6 | Fill #0

## 2017-02-24 DIAGNOSIS — J3089 Other allergic rhinitis: Secondary | ICD-10-CM | POA: Diagnosis not present

## 2017-02-24 DIAGNOSIS — J3081 Allergic rhinitis due to animal (cat) (dog) hair and dander: Secondary | ICD-10-CM | POA: Diagnosis not present

## 2017-02-24 DIAGNOSIS — J301 Allergic rhinitis due to pollen: Secondary | ICD-10-CM | POA: Diagnosis not present

## 2017-03-03 DIAGNOSIS — J3089 Other allergic rhinitis: Secondary | ICD-10-CM | POA: Diagnosis not present

## 2017-03-03 DIAGNOSIS — J3081 Allergic rhinitis due to animal (cat) (dog) hair and dander: Secondary | ICD-10-CM | POA: Diagnosis not present

## 2017-03-03 DIAGNOSIS — J301 Allergic rhinitis due to pollen: Secondary | ICD-10-CM | POA: Diagnosis not present

## 2017-03-10 DIAGNOSIS — J301 Allergic rhinitis due to pollen: Secondary | ICD-10-CM | POA: Diagnosis not present

## 2017-03-10 DIAGNOSIS — J3089 Other allergic rhinitis: Secondary | ICD-10-CM | POA: Diagnosis not present

## 2017-03-10 DIAGNOSIS — J3081 Allergic rhinitis due to animal (cat) (dog) hair and dander: Secondary | ICD-10-CM | POA: Diagnosis not present

## 2017-03-13 MED FILL — ACCU-CHEK FASTCLIX LANCETS: 90 days supply | Qty: 102 | Fill #1

## 2017-03-13 MED FILL — DESLORATADINE 5 MG TABLET: 5 | 90 days supply | Qty: 90 | Fill #0

## 2017-03-13 MED FILL — ADVAIR 100/50 DISKUS: 100-50 | 90 days supply | Qty: 180 | Fill #0

## 2017-03-13 MED FILL — SM ALCOHOL 70% PREP PADS: 70 | 30 days supply | Qty: 100 | Fill #1

## 2017-03-13 MED FILL — ACCU-CHEK GUIDE TEST STRIP: 90 days supply | Qty: 100 | Fill #0

## 2017-03-14 DIAGNOSIS — H401131 Primary open-angle glaucoma, bilateral, mild stage: Secondary | ICD-10-CM | POA: Diagnosis not present

## 2017-03-14 DIAGNOSIS — H10413 Chronic giant papillary conjunctivitis, bilateral: Secondary | ICD-10-CM | POA: Diagnosis not present

## 2017-03-14 MED FILL — TRAVATAN Z 0.004% EYE DROP: 0.004 | 90 days supply | Qty: 8 | Fill #0

## 2017-03-17 DIAGNOSIS — J3089 Other allergic rhinitis: Secondary | ICD-10-CM | POA: Diagnosis not present

## 2017-03-17 DIAGNOSIS — J3081 Allergic rhinitis due to animal (cat) (dog) hair and dander: Secondary | ICD-10-CM | POA: Diagnosis not present

## 2017-03-17 DIAGNOSIS — J301 Allergic rhinitis due to pollen: Secondary | ICD-10-CM | POA: Diagnosis not present

## 2017-03-24 DIAGNOSIS — J3081 Allergic rhinitis due to animal (cat) (dog) hair and dander: Secondary | ICD-10-CM | POA: Diagnosis not present

## 2017-03-24 DIAGNOSIS — J301 Allergic rhinitis due to pollen: Secondary | ICD-10-CM | POA: Diagnosis not present

## 2017-03-24 DIAGNOSIS — J3089 Other allergic rhinitis: Secondary | ICD-10-CM | POA: Diagnosis not present

## 2017-03-30 DIAGNOSIS — J301 Allergic rhinitis due to pollen: Secondary | ICD-10-CM | POA: Diagnosis not present

## 2017-03-30 DIAGNOSIS — J3089 Other allergic rhinitis: Secondary | ICD-10-CM | POA: Diagnosis not present

## 2017-03-30 DIAGNOSIS — J3081 Allergic rhinitis due to animal (cat) (dog) hair and dander: Secondary | ICD-10-CM | POA: Diagnosis not present

## 2017-04-07 DIAGNOSIS — J3089 Other allergic rhinitis: Secondary | ICD-10-CM | POA: Diagnosis not present

## 2017-04-07 DIAGNOSIS — J3081 Allergic rhinitis due to animal (cat) (dog) hair and dander: Secondary | ICD-10-CM | POA: Diagnosis not present

## 2017-04-07 DIAGNOSIS — J301 Allergic rhinitis due to pollen: Secondary | ICD-10-CM | POA: Diagnosis not present

## 2017-04-11 DIAGNOSIS — J301 Allergic rhinitis due to pollen: Secondary | ICD-10-CM | POA: Diagnosis not present

## 2017-04-11 DIAGNOSIS — J3089 Other allergic rhinitis: Secondary | ICD-10-CM | POA: Diagnosis not present

## 2017-04-11 DIAGNOSIS — J3081 Allergic rhinitis due to animal (cat) (dog) hair and dander: Secondary | ICD-10-CM | POA: Diagnosis not present

## 2017-04-18 MED FILL — FLUTICASONE PROP 50 MCG SPR: 50 | 30 days supply | Qty: 16 | Fill #0

## 2017-04-21 DIAGNOSIS — J3081 Allergic rhinitis due to animal (cat) (dog) hair and dander: Secondary | ICD-10-CM | POA: Diagnosis not present

## 2017-04-21 DIAGNOSIS — J3089 Other allergic rhinitis: Secondary | ICD-10-CM | POA: Diagnosis not present

## 2017-04-21 DIAGNOSIS — J301 Allergic rhinitis due to pollen: Secondary | ICD-10-CM | POA: Diagnosis not present

## 2017-04-26 DIAGNOSIS — J301 Allergic rhinitis due to pollen: Secondary | ICD-10-CM | POA: Diagnosis not present

## 2017-04-27 DIAGNOSIS — J3081 Allergic rhinitis due to animal (cat) (dog) hair and dander: Secondary | ICD-10-CM | POA: Diagnosis not present

## 2017-04-27 DIAGNOSIS — J3089 Other allergic rhinitis: Secondary | ICD-10-CM | POA: Diagnosis not present

## 2017-04-28 DIAGNOSIS — J301 Allergic rhinitis due to pollen: Secondary | ICD-10-CM | POA: Diagnosis not present

## 2017-04-28 DIAGNOSIS — J3089 Other allergic rhinitis: Secondary | ICD-10-CM | POA: Diagnosis not present

## 2017-04-28 DIAGNOSIS — J3081 Allergic rhinitis due to animal (cat) (dog) hair and dander: Secondary | ICD-10-CM | POA: Diagnosis not present

## 2017-05-05 DIAGNOSIS — J3089 Other allergic rhinitis: Secondary | ICD-10-CM | POA: Diagnosis not present

## 2017-05-05 DIAGNOSIS — J301 Allergic rhinitis due to pollen: Secondary | ICD-10-CM | POA: Diagnosis not present

## 2017-05-05 DIAGNOSIS — J3081 Allergic rhinitis due to animal (cat) (dog) hair and dander: Secondary | ICD-10-CM | POA: Diagnosis not present

## 2017-05-05 MED FILL — AZELASTINE HCL 137 MCG SPRY: 0.1 | 90 days supply | Qty: 90 | Fill #0

## 2017-05-05 MED FILL — MONTELUKAST SOD 10 MG TAB: 10 | 90 days supply | Qty: 90 | Fill #0

## 2017-05-16 DIAGNOSIS — J301 Allergic rhinitis due to pollen: Secondary | ICD-10-CM | POA: Diagnosis not present

## 2017-05-16 DIAGNOSIS — J3081 Allergic rhinitis due to animal (cat) (dog) hair and dander: Secondary | ICD-10-CM | POA: Diagnosis not present

## 2017-05-16 DIAGNOSIS — J3089 Other allergic rhinitis: Secondary | ICD-10-CM | POA: Diagnosis not present

## 2017-05-19 DIAGNOSIS — J301 Allergic rhinitis due to pollen: Secondary | ICD-10-CM | POA: Diagnosis not present

## 2017-05-19 DIAGNOSIS — J3089 Other allergic rhinitis: Secondary | ICD-10-CM | POA: Diagnosis not present

## 2017-05-19 DIAGNOSIS — J3081 Allergic rhinitis due to animal (cat) (dog) hair and dander: Secondary | ICD-10-CM | POA: Diagnosis not present

## 2017-05-26 DIAGNOSIS — J3089 Other allergic rhinitis: Secondary | ICD-10-CM | POA: Diagnosis not present

## 2017-05-26 DIAGNOSIS — J3081 Allergic rhinitis due to animal (cat) (dog) hair and dander: Secondary | ICD-10-CM | POA: Diagnosis not present

## 2017-05-26 DIAGNOSIS — J301 Allergic rhinitis due to pollen: Secondary | ICD-10-CM | POA: Diagnosis not present

## 2017-05-26 MED FILL — CLINDAMYCIN HCL 300 MG CAPS: 300 | 7 days supply | Qty: 28 | Fill #0

## 2017-06-02 DIAGNOSIS — J3081 Allergic rhinitis due to animal (cat) (dog) hair and dander: Secondary | ICD-10-CM | POA: Diagnosis not present

## 2017-06-02 DIAGNOSIS — J301 Allergic rhinitis due to pollen: Secondary | ICD-10-CM | POA: Diagnosis not present

## 2017-06-02 DIAGNOSIS — J3089 Other allergic rhinitis: Secondary | ICD-10-CM | POA: Diagnosis not present

## 2017-06-05 MED FILL — ACCU-CHEK FASTCLIX LANCETS: 90 days supply | Qty: 102 | Fill #2

## 2017-06-05 MED FILL — ACCU-CHEK GUIDE TEST STRIP: 90 days supply | Qty: 100 | Fill #1

## 2017-06-05 MED FILL — DESLORATADINE 5 MG TABLET: 5 | 90 days supply | Qty: 90 | Fill #1

## 2017-06-05 MED FILL — LISINOPRIL-HCTZ 10-12.5 MG: 10-12.5 | 90 days supply | Qty: 90 | Fill #0

## 2017-06-09 DIAGNOSIS — J301 Allergic rhinitis due to pollen: Secondary | ICD-10-CM | POA: Diagnosis not present

## 2017-06-09 DIAGNOSIS — J3089 Other allergic rhinitis: Secondary | ICD-10-CM | POA: Diagnosis not present

## 2017-06-09 DIAGNOSIS — J3081 Allergic rhinitis due to animal (cat) (dog) hair and dander: Secondary | ICD-10-CM | POA: Diagnosis not present

## 2017-06-16 DIAGNOSIS — J301 Allergic rhinitis due to pollen: Secondary | ICD-10-CM | POA: Diagnosis not present

## 2017-06-16 DIAGNOSIS — J3089 Other allergic rhinitis: Secondary | ICD-10-CM | POA: Diagnosis not present

## 2017-06-16 DIAGNOSIS — J3081 Allergic rhinitis due to animal (cat) (dog) hair and dander: Secondary | ICD-10-CM | POA: Diagnosis not present

## 2017-06-23 DIAGNOSIS — J3089 Other allergic rhinitis: Secondary | ICD-10-CM | POA: Diagnosis not present

## 2017-06-23 DIAGNOSIS — J3081 Allergic rhinitis due to animal (cat) (dog) hair and dander: Secondary | ICD-10-CM | POA: Diagnosis not present

## 2017-06-23 DIAGNOSIS — J301 Allergic rhinitis due to pollen: Secondary | ICD-10-CM | POA: Diagnosis not present

## 2017-06-26 MED FILL — TRAVATAN Z 0.004% EYE DROP: 0.004 | 90 days supply | Qty: 8 | Fill #1

## 2017-06-30 DIAGNOSIS — J3081 Allergic rhinitis due to animal (cat) (dog) hair and dander: Secondary | ICD-10-CM | POA: Diagnosis not present

## 2017-06-30 DIAGNOSIS — J301 Allergic rhinitis due to pollen: Secondary | ICD-10-CM | POA: Diagnosis not present

## 2017-06-30 DIAGNOSIS — J3089 Other allergic rhinitis: Secondary | ICD-10-CM | POA: Diagnosis not present

## 2017-07-14 DIAGNOSIS — J3089 Other allergic rhinitis: Secondary | ICD-10-CM | POA: Diagnosis not present

## 2017-07-14 DIAGNOSIS — J3081 Allergic rhinitis due to animal (cat) (dog) hair and dander: Secondary | ICD-10-CM | POA: Diagnosis not present

## 2017-07-14 DIAGNOSIS — J301 Allergic rhinitis due to pollen: Secondary | ICD-10-CM | POA: Diagnosis not present

## 2017-07-21 DIAGNOSIS — J3089 Other allergic rhinitis: Secondary | ICD-10-CM | POA: Diagnosis not present

## 2017-07-21 DIAGNOSIS — J301 Allergic rhinitis due to pollen: Secondary | ICD-10-CM | POA: Diagnosis not present

## 2017-07-28 DIAGNOSIS — J3081 Allergic rhinitis due to animal (cat) (dog) hair and dander: Secondary | ICD-10-CM | POA: Diagnosis not present

## 2017-07-28 DIAGNOSIS — J3089 Other allergic rhinitis: Secondary | ICD-10-CM | POA: Diagnosis not present

## 2017-07-28 DIAGNOSIS — J301 Allergic rhinitis due to pollen: Secondary | ICD-10-CM | POA: Diagnosis not present

## 2017-08-11 DIAGNOSIS — J301 Allergic rhinitis due to pollen: Secondary | ICD-10-CM | POA: Diagnosis not present

## 2017-08-11 DIAGNOSIS — J3089 Other allergic rhinitis: Secondary | ICD-10-CM | POA: Diagnosis not present

## 2017-08-11 DIAGNOSIS — J3081 Allergic rhinitis due to animal (cat) (dog) hair and dander: Secondary | ICD-10-CM | POA: Diagnosis not present

## 2017-08-14 MED FILL — ADVAIR 100/50 DISKUS: 100-50 | 90 days supply | Qty: 180 | Fill #1

## 2017-08-14 MED FILL — MONTELUKAST SOD 10 MG TAB: 10 | 90 days supply | Qty: 90 | Fill #1

## 2017-08-18 DIAGNOSIS — J301 Allergic rhinitis due to pollen: Secondary | ICD-10-CM | POA: Diagnosis not present

## 2017-08-18 DIAGNOSIS — J3081 Allergic rhinitis due to animal (cat) (dog) hair and dander: Secondary | ICD-10-CM | POA: Diagnosis not present

## 2017-08-18 DIAGNOSIS — J3089 Other allergic rhinitis: Secondary | ICD-10-CM | POA: Diagnosis not present

## 2017-08-28 DIAGNOSIS — J069 Acute upper respiratory infection, unspecified: Secondary | ICD-10-CM | POA: Diagnosis not present

## 2017-09-01 DIAGNOSIS — J301 Allergic rhinitis due to pollen: Secondary | ICD-10-CM | POA: Diagnosis not present

## 2017-09-01 DIAGNOSIS — J3089 Other allergic rhinitis: Secondary | ICD-10-CM | POA: Diagnosis not present

## 2017-09-01 DIAGNOSIS — J4530 Mild persistent asthma, uncomplicated: Secondary | ICD-10-CM | POA: Diagnosis not present

## 2017-09-01 DIAGNOSIS — J019 Acute sinusitis, unspecified: Secondary | ICD-10-CM | POA: Diagnosis not present

## 2017-09-01 MED FILL — AZITHROMYCIN 250 MG TABLET: 250 | 5 days supply | Qty: 6 | Fill #0

## 2017-09-08 DIAGNOSIS — J3081 Allergic rhinitis due to animal (cat) (dog) hair and dander: Secondary | ICD-10-CM | POA: Diagnosis not present

## 2017-09-08 DIAGNOSIS — J301 Allergic rhinitis due to pollen: Secondary | ICD-10-CM | POA: Diagnosis not present

## 2017-09-08 DIAGNOSIS — J3089 Other allergic rhinitis: Secondary | ICD-10-CM | POA: Diagnosis not present

## 2017-09-08 MED FILL — DESLORATADINE 5 MG TABLET: 5 | 90 days supply | Qty: 90 | Fill #0

## 2017-09-08 MED FILL — ACCU-CHEK FASTCLIX LANCETS: 90 days supply | Qty: 102 | Fill #3

## 2017-09-08 MED FILL — LISINOPRIL-HCTZ 10-12.5 MG: 10-12.5 | 90 days supply | Qty: 90 | Fill #1

## 2017-09-08 MED FILL — ACCU-CHEK GUIDE TEST STRIP: 90 days supply | Qty: 100 | Fill #2

## 2017-09-08 MED FILL — SM ALCOHOL 70% PREP PADS: 70 | 90 days supply | Qty: 100 | Fill #0

## 2017-09-15 ENCOUNTER — Other Ambulatory Visit: Payer: Self-pay | Admitting: Family Medicine

## 2017-09-15 DIAGNOSIS — J301 Allergic rhinitis due to pollen: Secondary | ICD-10-CM | POA: Diagnosis not present

## 2017-09-15 DIAGNOSIS — J3089 Other allergic rhinitis: Secondary | ICD-10-CM | POA: Diagnosis not present

## 2017-09-15 DIAGNOSIS — Z1231 Encounter for screening mammogram for malignant neoplasm of breast: Secondary | ICD-10-CM

## 2017-09-15 DIAGNOSIS — J3081 Allergic rhinitis due to animal (cat) (dog) hair and dander: Secondary | ICD-10-CM | POA: Diagnosis not present

## 2017-09-22 DIAGNOSIS — J3081 Allergic rhinitis due to animal (cat) (dog) hair and dander: Secondary | ICD-10-CM | POA: Diagnosis not present

## 2017-09-22 DIAGNOSIS — J301 Allergic rhinitis due to pollen: Secondary | ICD-10-CM | POA: Diagnosis not present

## 2017-09-22 DIAGNOSIS — H401131 Primary open-angle glaucoma, bilateral, mild stage: Secondary | ICD-10-CM | POA: Diagnosis not present

## 2017-09-22 DIAGNOSIS — H10413 Chronic giant papillary conjunctivitis, bilateral: Secondary | ICD-10-CM | POA: Diagnosis not present

## 2017-09-22 DIAGNOSIS — J3089 Other allergic rhinitis: Secondary | ICD-10-CM | POA: Diagnosis not present

## 2017-09-22 DIAGNOSIS — H2513 Age-related nuclear cataract, bilateral: Secondary | ICD-10-CM | POA: Diagnosis not present

## 2017-10-06 DIAGNOSIS — J301 Allergic rhinitis due to pollen: Secondary | ICD-10-CM | POA: Diagnosis not present

## 2017-10-06 DIAGNOSIS — J3081 Allergic rhinitis due to animal (cat) (dog) hair and dander: Secondary | ICD-10-CM | POA: Diagnosis not present

## 2017-10-06 DIAGNOSIS — J3089 Other allergic rhinitis: Secondary | ICD-10-CM | POA: Diagnosis not present

## 2017-10-13 ENCOUNTER — Ambulatory Visit: Payer: 59

## 2017-10-13 ENCOUNTER — Ambulatory Visit
Admission: RE | Admit: 2017-10-13 | Discharge: 2017-10-13 | Disposition: A | Discharge status: Home / Self Care | Payer: 59 | Source: Ambulatory Visit | Attending: Family Medicine | Admitting: Family Medicine

## 2017-10-13 DIAGNOSIS — Z1231 Encounter for screening mammogram for malignant neoplasm of breast: Secondary | ICD-10-CM | POA: Diagnosis not present

## 2017-10-18 DIAGNOSIS — J301 Allergic rhinitis due to pollen: Secondary | ICD-10-CM | POA: Diagnosis not present

## 2017-10-19 DIAGNOSIS — J3081 Allergic rhinitis due to animal (cat) (dog) hair and dander: Secondary | ICD-10-CM | POA: Diagnosis not present

## 2017-10-19 DIAGNOSIS — J3089 Other allergic rhinitis: Secondary | ICD-10-CM | POA: Diagnosis not present

## 2017-10-20 DIAGNOSIS — J3081 Allergic rhinitis due to animal (cat) (dog) hair and dander: Secondary | ICD-10-CM | POA: Diagnosis not present

## 2017-10-20 DIAGNOSIS — J3089 Other allergic rhinitis: Secondary | ICD-10-CM | POA: Diagnosis not present

## 2017-10-20 DIAGNOSIS — J301 Allergic rhinitis due to pollen: Secondary | ICD-10-CM | POA: Diagnosis not present

## 2017-10-26 DIAGNOSIS — J3081 Allergic rhinitis due to animal (cat) (dog) hair and dander: Secondary | ICD-10-CM | POA: Diagnosis not present

## 2017-10-26 DIAGNOSIS — J301 Allergic rhinitis due to pollen: Secondary | ICD-10-CM | POA: Diagnosis not present

## 2017-10-26 DIAGNOSIS — J3089 Other allergic rhinitis: Secondary | ICD-10-CM | POA: Diagnosis not present

## 2017-11-02 MED FILL — EPINEPHRINE 0.3 MG AUTO-INJ: 0.3 | 30 days supply | Qty: 2 | Fill #1

## 2017-11-10 DIAGNOSIS — J3089 Other allergic rhinitis: Secondary | ICD-10-CM | POA: Diagnosis not present

## 2017-11-10 DIAGNOSIS — J3081 Allergic rhinitis due to animal (cat) (dog) hair and dander: Secondary | ICD-10-CM | POA: Diagnosis not present

## 2017-11-10 DIAGNOSIS — I10 Essential (primary) hypertension: Secondary | ICD-10-CM | POA: Diagnosis not present

## 2017-11-10 DIAGNOSIS — J301 Allergic rhinitis due to pollen: Secondary | ICD-10-CM | POA: Diagnosis not present

## 2017-11-10 DIAGNOSIS — Z0000 Encounter for general adult medical examination without abnormal findings: Secondary | ICD-10-CM | POA: Diagnosis not present

## 2017-11-10 DIAGNOSIS — R7303 Prediabetes: Secondary | ICD-10-CM | POA: Diagnosis not present

## 2017-11-10 DIAGNOSIS — Z1159 Encounter for screening for other viral diseases: Secondary | ICD-10-CM | POA: Diagnosis not present

## 2017-11-10 DIAGNOSIS — E7800 Pure hypercholesterolemia, unspecified: Secondary | ICD-10-CM | POA: Diagnosis not present

## 2017-11-13 MED FILL — MONTELUKAST SOD 10 MG TAB: 10 | 90 days supply | Qty: 90 | Fill #0

## 2017-11-14 MED FILL — TRAVATAN Z 0.004% EYE DROP: 0.004 | 60 days supply | Qty: 5 | Fill #2

## 2017-11-17 DIAGNOSIS — J3089 Other allergic rhinitis: Secondary | ICD-10-CM | POA: Diagnosis not present

## 2017-11-17 DIAGNOSIS — J3081 Allergic rhinitis due to animal (cat) (dog) hair and dander: Secondary | ICD-10-CM | POA: Diagnosis not present

## 2017-11-17 DIAGNOSIS — J301 Allergic rhinitis due to pollen: Secondary | ICD-10-CM | POA: Diagnosis not present

## 2017-11-20 MED FILL — FLUTICASONE PROP 50 MCG SPR: 50 | 90 days supply | Qty: 48 | Fill #1

## 2017-11-24 DIAGNOSIS — J3089 Other allergic rhinitis: Secondary | ICD-10-CM | POA: Diagnosis not present

## 2017-11-24 DIAGNOSIS — J301 Allergic rhinitis due to pollen: Secondary | ICD-10-CM | POA: Diagnosis not present

## 2017-11-24 DIAGNOSIS — J3081 Allergic rhinitis due to animal (cat) (dog) hair and dander: Secondary | ICD-10-CM | POA: Diagnosis not present

## 2017-12-01 DIAGNOSIS — J3081 Allergic rhinitis due to animal (cat) (dog) hair and dander: Secondary | ICD-10-CM | POA: Diagnosis not present

## 2017-12-01 DIAGNOSIS — J3089 Other allergic rhinitis: Secondary | ICD-10-CM | POA: Diagnosis not present

## 2017-12-01 DIAGNOSIS — J301 Allergic rhinitis due to pollen: Secondary | ICD-10-CM | POA: Diagnosis not present

## 2017-12-07 MED FILL — AMOXICILLIN 500 MG CAPSULE: 500 | 7 days supply | Qty: 21 | Fill #0

## 2017-12-08 DIAGNOSIS — J3081 Allergic rhinitis due to animal (cat) (dog) hair and dander: Secondary | ICD-10-CM | POA: Diagnosis not present

## 2017-12-08 DIAGNOSIS — J3089 Other allergic rhinitis: Secondary | ICD-10-CM | POA: Diagnosis not present

## 2017-12-08 DIAGNOSIS — J301 Allergic rhinitis due to pollen: Secondary | ICD-10-CM | POA: Diagnosis not present

## 2017-12-15 DIAGNOSIS — J3089 Other allergic rhinitis: Secondary | ICD-10-CM | POA: Diagnosis not present

## 2017-12-15 DIAGNOSIS — J3081 Allergic rhinitis due to animal (cat) (dog) hair and dander: Secondary | ICD-10-CM | POA: Diagnosis not present

## 2017-12-15 DIAGNOSIS — J301 Allergic rhinitis due to pollen: Secondary | ICD-10-CM | POA: Diagnosis not present

## 2017-12-18 MED FILL — LISINOPRIL-HCTZ 10-12.5 MG: 10-12.5 | 90 days supply | Qty: 90 | Fill #0

## 2017-12-18 MED FILL — DESLORATADINE 5 MG TABLET: 5 | 90 days supply | Qty: 90 | Fill #1

## 2017-12-18 MED FILL — ULTICARE ALCOHOL SWABS 70 %: 70 | 90 days supply | Qty: 100 | Fill #1

## 2017-12-22 DIAGNOSIS — J301 Allergic rhinitis due to pollen: Secondary | ICD-10-CM | POA: Diagnosis not present

## 2017-12-22 DIAGNOSIS — J3081 Allergic rhinitis due to animal (cat) (dog) hair and dander: Secondary | ICD-10-CM | POA: Diagnosis not present

## 2017-12-22 DIAGNOSIS — J3089 Other allergic rhinitis: Secondary | ICD-10-CM | POA: Diagnosis not present

## 2017-12-29 DIAGNOSIS — J301 Allergic rhinitis due to pollen: Secondary | ICD-10-CM | POA: Diagnosis not present

## 2017-12-29 DIAGNOSIS — J3081 Allergic rhinitis due to animal (cat) (dog) hair and dander: Secondary | ICD-10-CM | POA: Diagnosis not present

## 2017-12-29 DIAGNOSIS — J3089 Other allergic rhinitis: Secondary | ICD-10-CM | POA: Diagnosis not present

## 2018-01-05 DIAGNOSIS — J301 Allergic rhinitis due to pollen: Secondary | ICD-10-CM | POA: Diagnosis not present

## 2018-01-05 DIAGNOSIS — J3081 Allergic rhinitis due to animal (cat) (dog) hair and dander: Secondary | ICD-10-CM | POA: Diagnosis not present

## 2018-01-05 DIAGNOSIS — J3089 Other allergic rhinitis: Secondary | ICD-10-CM | POA: Diagnosis not present

## 2018-01-19 DIAGNOSIS — J3081 Allergic rhinitis due to animal (cat) (dog) hair and dander: Secondary | ICD-10-CM | POA: Diagnosis not present

## 2018-01-19 DIAGNOSIS — J301 Allergic rhinitis due to pollen: Secondary | ICD-10-CM | POA: Diagnosis not present

## 2018-01-19 DIAGNOSIS — J3089 Other allergic rhinitis: Secondary | ICD-10-CM | POA: Diagnosis not present

## 2018-01-26 DIAGNOSIS — J3081 Allergic rhinitis due to animal (cat) (dog) hair and dander: Secondary | ICD-10-CM | POA: Diagnosis not present

## 2018-01-26 DIAGNOSIS — J301 Allergic rhinitis due to pollen: Secondary | ICD-10-CM | POA: Diagnosis not present

## 2018-01-26 DIAGNOSIS — J3089 Other allergic rhinitis: Secondary | ICD-10-CM | POA: Diagnosis not present

## 2018-01-30 DIAGNOSIS — H524 Presbyopia: Secondary | ICD-10-CM | POA: Diagnosis not present

## 2018-02-02 DIAGNOSIS — J3081 Allergic rhinitis due to animal (cat) (dog) hair and dander: Secondary | ICD-10-CM | POA: Diagnosis not present

## 2018-02-02 DIAGNOSIS — J3089 Other allergic rhinitis: Secondary | ICD-10-CM | POA: Diagnosis not present

## 2018-02-02 DIAGNOSIS — J301 Allergic rhinitis due to pollen: Secondary | ICD-10-CM | POA: Diagnosis not present

## 2018-02-05 MED FILL — ACCU-CHEK GUIDE STRP: 90 days supply | Qty: 100 | Fill #3

## 2018-02-06 MED FILL — ADVAIR 100/50 DISKUS: 100-50 | 30 days supply | Qty: 60 | Fill #0

## 2018-02-06 MED FILL — TRAVATAN Z 0.004% EYE DROP: 0.004 | 90 days supply | Qty: 8 | Fill #0

## 2018-02-06 MED FILL — MONTELUKAST SOD 10 MG TAB: 10 | 30 days supply | Qty: 30 | Fill #0

## 2018-02-07 MED FILL — OLOPATADINE HCL 0.2% EYE DR: 0.2 | 25 days supply | Qty: 3 | Fill #0

## 2018-02-12 DIAGNOSIS — H10413 Chronic giant papillary conjunctivitis, bilateral: Secondary | ICD-10-CM | POA: Diagnosis not present

## 2018-02-12 MED FILL — TOBRAMYCIN-DEXAMETH OPHTH S: 0.3-0.1 | 7 days supply | Qty: 3 | Fill #0

## 2018-02-16 DIAGNOSIS — J3089 Other allergic rhinitis: Secondary | ICD-10-CM | POA: Diagnosis not present

## 2018-02-16 DIAGNOSIS — J3081 Allergic rhinitis due to animal (cat) (dog) hair and dander: Secondary | ICD-10-CM | POA: Diagnosis not present

## 2018-02-16 DIAGNOSIS — J301 Allergic rhinitis due to pollen: Secondary | ICD-10-CM | POA: Diagnosis not present

## 2018-02-20 DIAGNOSIS — J3081 Allergic rhinitis due to animal (cat) (dog) hair and dander: Secondary | ICD-10-CM | POA: Diagnosis not present

## 2018-02-20 DIAGNOSIS — J3089 Other allergic rhinitis: Secondary | ICD-10-CM | POA: Diagnosis not present

## 2018-02-20 DIAGNOSIS — J301 Allergic rhinitis due to pollen: Secondary | ICD-10-CM | POA: Diagnosis not present

## 2018-02-20 DIAGNOSIS — J4530 Mild persistent asthma, uncomplicated: Secondary | ICD-10-CM | POA: Diagnosis not present

## 2018-03-05 DIAGNOSIS — J3089 Other allergic rhinitis: Secondary | ICD-10-CM | POA: Diagnosis not present

## 2018-03-05 DIAGNOSIS — J301 Allergic rhinitis due to pollen: Secondary | ICD-10-CM | POA: Diagnosis not present

## 2018-03-05 DIAGNOSIS — J3081 Allergic rhinitis due to animal (cat) (dog) hair and dander: Secondary | ICD-10-CM | POA: Diagnosis not present

## 2018-03-16 DIAGNOSIS — J3081 Allergic rhinitis due to animal (cat) (dog) hair and dander: Secondary | ICD-10-CM | POA: Diagnosis not present

## 2018-03-16 DIAGNOSIS — J3089 Other allergic rhinitis: Secondary | ICD-10-CM | POA: Diagnosis not present

## 2018-03-16 DIAGNOSIS — J301 Allergic rhinitis due to pollen: Secondary | ICD-10-CM | POA: Diagnosis not present

## 2018-03-19 MED FILL — ADVAIR 100/50 DISKUS: 100-50 | 90 days supply | Qty: 180 | Fill #0

## 2018-03-19 MED FILL — ULTICARE ALCOHOL SWABS 70 %: 70 | 90 days supply | Qty: 100 | Fill #2

## 2018-03-19 MED FILL — AZELASTINE HCL 137 MCG SPRY: 0.1 | 90 days supply | Qty: 90 | Fill #1

## 2018-03-19 MED FILL — OLOPATADINE HCL 0.2 % SOLN: 0.2 | 25 days supply | Qty: 3 | Fill #1

## 2018-03-19 MED FILL — LISINOPRIL-HCTZ 10-12.5 MG: 10-12.5 | 90 days supply | Qty: 90 | Fill #1

## 2018-03-19 MED FILL — MONTELUKAST SOD 10 MG TAB: 10 | 90 days supply | Qty: 90 | Fill #0

## 2018-03-21 DIAGNOSIS — J3089 Other allergic rhinitis: Secondary | ICD-10-CM | POA: Diagnosis not present

## 2018-03-21 DIAGNOSIS — J3081 Allergic rhinitis due to animal (cat) (dog) hair and dander: Secondary | ICD-10-CM | POA: Diagnosis not present

## 2018-03-21 DIAGNOSIS — J301 Allergic rhinitis due to pollen: Secondary | ICD-10-CM | POA: Diagnosis not present

## 2018-03-21 MED FILL — DESLORATADINE 5 MG TAB: 5 | 90 days supply | Qty: 90 | Fill #0

## 2018-03-29 DIAGNOSIS — J3089 Other allergic rhinitis: Secondary | ICD-10-CM | POA: Diagnosis not present

## 2018-03-29 DIAGNOSIS — J3081 Allergic rhinitis due to animal (cat) (dog) hair and dander: Secondary | ICD-10-CM | POA: Diagnosis not present

## 2018-03-29 DIAGNOSIS — J301 Allergic rhinitis due to pollen: Secondary | ICD-10-CM | POA: Diagnosis not present

## 2018-04-06 DIAGNOSIS — J301 Allergic rhinitis due to pollen: Secondary | ICD-10-CM | POA: Diagnosis not present

## 2018-04-06 DIAGNOSIS — J3089 Other allergic rhinitis: Secondary | ICD-10-CM | POA: Diagnosis not present

## 2018-04-06 DIAGNOSIS — J3081 Allergic rhinitis due to animal (cat) (dog) hair and dander: Secondary | ICD-10-CM | POA: Diagnosis not present

## 2018-04-12 DIAGNOSIS — J301 Allergic rhinitis due to pollen: Secondary | ICD-10-CM | POA: Diagnosis not present

## 2018-04-12 DIAGNOSIS — J3081 Allergic rhinitis due to animal (cat) (dog) hair and dander: Secondary | ICD-10-CM | POA: Diagnosis not present

## 2018-04-12 DIAGNOSIS — J3089 Other allergic rhinitis: Secondary | ICD-10-CM | POA: Diagnosis not present

## 2018-04-26 DIAGNOSIS — J3089 Other allergic rhinitis: Secondary | ICD-10-CM | POA: Diagnosis not present

## 2018-04-26 DIAGNOSIS — J3081 Allergic rhinitis due to animal (cat) (dog) hair and dander: Secondary | ICD-10-CM | POA: Diagnosis not present

## 2018-04-26 DIAGNOSIS — J301 Allergic rhinitis due to pollen: Secondary | ICD-10-CM | POA: Diagnosis not present

## 2018-05-04 DIAGNOSIS — J3089 Other allergic rhinitis: Secondary | ICD-10-CM | POA: Diagnosis not present

## 2018-05-04 DIAGNOSIS — J301 Allergic rhinitis due to pollen: Secondary | ICD-10-CM | POA: Diagnosis not present

## 2018-05-04 DIAGNOSIS — J3081 Allergic rhinitis due to animal (cat) (dog) hair and dander: Secondary | ICD-10-CM | POA: Diagnosis not present

## 2018-05-04 MED FILL — ACCU-CHEK GUIDE STRP: 90 days supply | Qty: 100 | Fill #0

## 2018-05-07 MED FILL — TRAVATAN Z 0.004% EYE DROP: 0.004 | 50 days supply | Qty: 5 | Fill #0

## 2018-05-09 DIAGNOSIS — H2513 Age-related nuclear cataract, bilateral: Secondary | ICD-10-CM | POA: Diagnosis not present

## 2018-05-09 DIAGNOSIS — H401111 Primary open-angle glaucoma, right eye, mild stage: Secondary | ICD-10-CM | POA: Diagnosis not present

## 2018-05-09 DIAGNOSIS — H401122 Primary open-angle glaucoma, left eye, moderate stage: Secondary | ICD-10-CM | POA: Diagnosis not present

## 2018-05-11 DIAGNOSIS — I10 Essential (primary) hypertension: Secondary | ICD-10-CM | POA: Diagnosis not present

## 2018-05-11 DIAGNOSIS — E7800 Pure hypercholesterolemia, unspecified: Secondary | ICD-10-CM | POA: Diagnosis not present

## 2018-05-11 DIAGNOSIS — R7303 Prediabetes: Secondary | ICD-10-CM | POA: Diagnosis not present

## 2018-05-17 DIAGNOSIS — J3081 Allergic rhinitis due to animal (cat) (dog) hair and dander: Secondary | ICD-10-CM | POA: Diagnosis not present

## 2018-05-17 DIAGNOSIS — J301 Allergic rhinitis due to pollen: Secondary | ICD-10-CM | POA: Diagnosis not present

## 2018-05-17 DIAGNOSIS — J3089 Other allergic rhinitis: Secondary | ICD-10-CM | POA: Diagnosis not present

## 2018-05-18 DIAGNOSIS — J3081 Allergic rhinitis due to animal (cat) (dog) hair and dander: Secondary | ICD-10-CM | POA: Diagnosis not present

## 2018-05-18 DIAGNOSIS — J3089 Other allergic rhinitis: Secondary | ICD-10-CM | POA: Diagnosis not present

## 2018-05-25 DIAGNOSIS — J3089 Other allergic rhinitis: Secondary | ICD-10-CM | POA: Diagnosis not present

## 2018-05-25 DIAGNOSIS — J3081 Allergic rhinitis due to animal (cat) (dog) hair and dander: Secondary | ICD-10-CM | POA: Diagnosis not present

## 2018-05-25 DIAGNOSIS — J301 Allergic rhinitis due to pollen: Secondary | ICD-10-CM | POA: Diagnosis not present

## 2018-06-07 DIAGNOSIS — J3081 Allergic rhinitis due to animal (cat) (dog) hair and dander: Secondary | ICD-10-CM | POA: Diagnosis not present

## 2018-06-07 DIAGNOSIS — J301 Allergic rhinitis due to pollen: Secondary | ICD-10-CM | POA: Diagnosis not present

## 2018-06-07 DIAGNOSIS — J3089 Other allergic rhinitis: Secondary | ICD-10-CM | POA: Diagnosis not present

## 2018-06-15 DIAGNOSIS — J3081 Allergic rhinitis due to animal (cat) (dog) hair and dander: Secondary | ICD-10-CM | POA: Diagnosis not present

## 2018-06-15 DIAGNOSIS — J301 Allergic rhinitis due to pollen: Secondary | ICD-10-CM | POA: Diagnosis not present

## 2018-06-15 DIAGNOSIS — J3089 Other allergic rhinitis: Secondary | ICD-10-CM | POA: Diagnosis not present

## 2018-06-19 DIAGNOSIS — J3089 Other allergic rhinitis: Secondary | ICD-10-CM | POA: Diagnosis not present

## 2018-06-19 DIAGNOSIS — J3081 Allergic rhinitis due to animal (cat) (dog) hair and dander: Secondary | ICD-10-CM | POA: Diagnosis not present

## 2018-06-19 DIAGNOSIS — J301 Allergic rhinitis due to pollen: Secondary | ICD-10-CM | POA: Diagnosis not present

## 2018-06-21 DIAGNOSIS — J3089 Other allergic rhinitis: Secondary | ICD-10-CM | POA: Diagnosis not present

## 2018-06-21 DIAGNOSIS — J301 Allergic rhinitis due to pollen: Secondary | ICD-10-CM | POA: Diagnosis not present

## 2018-06-21 DIAGNOSIS — J3081 Allergic rhinitis due to animal (cat) (dog) hair and dander: Secondary | ICD-10-CM | POA: Diagnosis not present

## 2018-06-21 MED FILL — MONTELUKAST SOD 10 MG TAB: 10 | 90 days supply | Qty: 90 | Fill #1

## 2018-06-21 MED FILL — ULTICARE ALCOHOL SWABS 70 %: 70 | 90 days supply | Qty: 100 | Fill #0

## 2018-06-21 MED FILL — DESLORATADINE 5 MG TAB: 5 | 90 days supply | Qty: 90 | Fill #1

## 2018-06-21 MED FILL — LISINOPRIL-HCTZ 10-12.5 MG: 10-12.5 | 90 days supply | Qty: 90 | Fill #2

## 2018-06-29 DIAGNOSIS — J3081 Allergic rhinitis due to animal (cat) (dog) hair and dander: Secondary | ICD-10-CM | POA: Diagnosis not present

## 2018-06-29 DIAGNOSIS — J301 Allergic rhinitis due to pollen: Secondary | ICD-10-CM | POA: Diagnosis not present

## 2018-06-29 DIAGNOSIS — J3089 Other allergic rhinitis: Secondary | ICD-10-CM | POA: Diagnosis not present

## 2018-07-02 DIAGNOSIS — J3081 Allergic rhinitis due to animal (cat) (dog) hair and dander: Secondary | ICD-10-CM | POA: Diagnosis not present

## 2018-07-02 DIAGNOSIS — T63421D Toxic effect of venom of ants, accidental (unintentional), subsequent encounter: Secondary | ICD-10-CM | POA: Diagnosis not present

## 2018-07-02 DIAGNOSIS — J301 Allergic rhinitis due to pollen: Secondary | ICD-10-CM | POA: Diagnosis not present

## 2018-07-02 DIAGNOSIS — J3089 Other allergic rhinitis: Secondary | ICD-10-CM | POA: Diagnosis not present

## 2018-07-05 DIAGNOSIS — J301 Allergic rhinitis due to pollen: Secondary | ICD-10-CM | POA: Diagnosis not present

## 2018-07-05 DIAGNOSIS — J3089 Other allergic rhinitis: Secondary | ICD-10-CM | POA: Diagnosis not present

## 2018-07-05 DIAGNOSIS — J3081 Allergic rhinitis due to animal (cat) (dog) hair and dander: Secondary | ICD-10-CM | POA: Diagnosis not present

## 2018-07-10 DIAGNOSIS — J3081 Allergic rhinitis due to animal (cat) (dog) hair and dander: Secondary | ICD-10-CM | POA: Diagnosis not present

## 2018-07-10 DIAGNOSIS — J3089 Other allergic rhinitis: Secondary | ICD-10-CM | POA: Diagnosis not present

## 2018-07-10 DIAGNOSIS — J301 Allergic rhinitis due to pollen: Secondary | ICD-10-CM | POA: Diagnosis not present

## 2018-07-18 DIAGNOSIS — J3081 Allergic rhinitis due to animal (cat) (dog) hair and dander: Secondary | ICD-10-CM | POA: Diagnosis not present

## 2018-07-18 DIAGNOSIS — J3089 Other allergic rhinitis: Secondary | ICD-10-CM | POA: Diagnosis not present

## 2018-07-18 DIAGNOSIS — J301 Allergic rhinitis due to pollen: Secondary | ICD-10-CM | POA: Diagnosis not present

## 2018-08-02 DIAGNOSIS — J3081 Allergic rhinitis due to animal (cat) (dog) hair and dander: Secondary | ICD-10-CM | POA: Diagnosis not present

## 2018-08-02 DIAGNOSIS — J3089 Other allergic rhinitis: Secondary | ICD-10-CM | POA: Diagnosis not present

## 2018-08-02 DIAGNOSIS — J301 Allergic rhinitis due to pollen: Secondary | ICD-10-CM | POA: Diagnosis not present

## 2018-08-06 DIAGNOSIS — J3081 Allergic rhinitis due to animal (cat) (dog) hair and dander: Secondary | ICD-10-CM | POA: Diagnosis not present

## 2018-08-06 DIAGNOSIS — J301 Allergic rhinitis due to pollen: Secondary | ICD-10-CM | POA: Diagnosis not present

## 2018-08-06 DIAGNOSIS — J3089 Other allergic rhinitis: Secondary | ICD-10-CM | POA: Diagnosis not present

## 2018-08-06 MED FILL — ACCU-CHEK GUIDE STRP: 90 days supply | Qty: 100 | Fill #1

## 2018-08-06 MED FILL — TRAVATAN Z 0.004% EYE DROP: 0.004 | 90 days supply | Qty: 8 | Fill #1

## 2018-08-17 DIAGNOSIS — J3081 Allergic rhinitis due to animal (cat) (dog) hair and dander: Secondary | ICD-10-CM | POA: Diagnosis not present

## 2018-08-17 DIAGNOSIS — J3089 Other allergic rhinitis: Secondary | ICD-10-CM | POA: Diagnosis not present

## 2018-08-17 DIAGNOSIS — J301 Allergic rhinitis due to pollen: Secondary | ICD-10-CM | POA: Diagnosis not present

## 2018-08-23 DIAGNOSIS — J3081 Allergic rhinitis due to animal (cat) (dog) hair and dander: Secondary | ICD-10-CM | POA: Diagnosis not present

## 2018-08-23 DIAGNOSIS — J3089 Other allergic rhinitis: Secondary | ICD-10-CM | POA: Diagnosis not present

## 2018-08-23 DIAGNOSIS — J301 Allergic rhinitis due to pollen: Secondary | ICD-10-CM | POA: Diagnosis not present

## 2018-08-29 DIAGNOSIS — J301 Allergic rhinitis due to pollen: Secondary | ICD-10-CM | POA: Diagnosis not present

## 2018-08-29 DIAGNOSIS — J3081 Allergic rhinitis due to animal (cat) (dog) hair and dander: Secondary | ICD-10-CM | POA: Diagnosis not present

## 2018-08-29 DIAGNOSIS — J3089 Other allergic rhinitis: Secondary | ICD-10-CM | POA: Diagnosis not present

## 2018-09-07 DIAGNOSIS — J3081 Allergic rhinitis due to animal (cat) (dog) hair and dander: Secondary | ICD-10-CM | POA: Diagnosis not present

## 2018-09-07 DIAGNOSIS — J301 Allergic rhinitis due to pollen: Secondary | ICD-10-CM | POA: Diagnosis not present

## 2018-09-07 DIAGNOSIS — J3089 Other allergic rhinitis: Secondary | ICD-10-CM | POA: Diagnosis not present

## 2018-09-10 ENCOUNTER — Other Ambulatory Visit: Payer: Self-pay | Admitting: Family Medicine

## 2018-09-10 DIAGNOSIS — Z1231 Encounter for screening mammogram for malignant neoplasm of breast: Secondary | ICD-10-CM

## 2018-09-17 MED FILL — LISINOPRIL-HCTZ 10-12.5 MG: 10-12.5 | 90 days supply | Qty: 90 | Fill #3

## 2018-09-21 DIAGNOSIS — J3081 Allergic rhinitis due to animal (cat) (dog) hair and dander: Secondary | ICD-10-CM | POA: Diagnosis not present

## 2018-09-21 DIAGNOSIS — J3089 Other allergic rhinitis: Secondary | ICD-10-CM | POA: Diagnosis not present

## 2018-09-21 DIAGNOSIS — J301 Allergic rhinitis due to pollen: Secondary | ICD-10-CM | POA: Diagnosis not present

## 2018-09-28 DIAGNOSIS — J301 Allergic rhinitis due to pollen: Secondary | ICD-10-CM | POA: Diagnosis not present

## 2018-09-28 DIAGNOSIS — J3089 Other allergic rhinitis: Secondary | ICD-10-CM | POA: Diagnosis not present

## 2018-09-28 DIAGNOSIS — J3081 Allergic rhinitis due to animal (cat) (dog) hair and dander: Secondary | ICD-10-CM | POA: Diagnosis not present

## 2018-10-02 DIAGNOSIS — J301 Allergic rhinitis due to pollen: Secondary | ICD-10-CM | POA: Diagnosis not present

## 2018-10-02 DIAGNOSIS — J3089 Other allergic rhinitis: Secondary | ICD-10-CM | POA: Diagnosis not present

## 2018-10-02 DIAGNOSIS — J3081 Allergic rhinitis due to animal (cat) (dog) hair and dander: Secondary | ICD-10-CM | POA: Diagnosis not present

## 2018-10-08 DIAGNOSIS — J3081 Allergic rhinitis due to animal (cat) (dog) hair and dander: Secondary | ICD-10-CM | POA: Diagnosis not present

## 2018-10-08 DIAGNOSIS — J3089 Other allergic rhinitis: Secondary | ICD-10-CM | POA: Diagnosis not present

## 2018-10-08 DIAGNOSIS — J301 Allergic rhinitis due to pollen: Secondary | ICD-10-CM | POA: Diagnosis not present

## 2018-10-08 MED FILL — ADVAIR 100/50 DISKUS: 100-50 | 90 days supply | Qty: 180 | Fill #1

## 2018-10-08 MED FILL — ULTICARE ALCOHOL SWABS 70 %: 70 | 90 days supply | Qty: 100 | Fill #1

## 2018-10-16 DIAGNOSIS — J3089 Other allergic rhinitis: Secondary | ICD-10-CM | POA: Diagnosis not present

## 2018-10-16 DIAGNOSIS — J301 Allergic rhinitis due to pollen: Secondary | ICD-10-CM | POA: Diagnosis not present

## 2018-10-16 DIAGNOSIS — J3081 Allergic rhinitis due to animal (cat) (dog) hair and dander: Secondary | ICD-10-CM | POA: Diagnosis not present

## 2018-10-18 MED FILL — DESLORATADINE 5 MG TAB: 5 | 90 days supply | Qty: 90 | Fill #2

## 2018-10-19 MED FILL — MONTELUKAST SOD 10 MG TAB: 10 | 30 days supply | Qty: 30 | Fill #0

## 2018-10-22 ENCOUNTER — Ambulatory Visit: Payer: 59

## 2018-10-24 DIAGNOSIS — J3089 Other allergic rhinitis: Secondary | ICD-10-CM | POA: Diagnosis not present

## 2018-10-24 DIAGNOSIS — J301 Allergic rhinitis due to pollen: Secondary | ICD-10-CM | POA: Diagnosis not present

## 2018-10-24 DIAGNOSIS — J3081 Allergic rhinitis due to animal (cat) (dog) hair and dander: Secondary | ICD-10-CM | POA: Diagnosis not present

## 2018-10-30 DIAGNOSIS — J301 Allergic rhinitis due to pollen: Secondary | ICD-10-CM | POA: Diagnosis not present

## 2018-11-01 DIAGNOSIS — J3081 Allergic rhinitis due to animal (cat) (dog) hair and dander: Secondary | ICD-10-CM | POA: Diagnosis not present

## 2018-11-01 DIAGNOSIS — J3089 Other allergic rhinitis: Secondary | ICD-10-CM | POA: Diagnosis not present

## 2018-11-08 DIAGNOSIS — J3081 Allergic rhinitis due to animal (cat) (dog) hair and dander: Secondary | ICD-10-CM | POA: Diagnosis not present

## 2018-11-08 DIAGNOSIS — J3089 Other allergic rhinitis: Secondary | ICD-10-CM | POA: Diagnosis not present

## 2018-11-08 DIAGNOSIS — J301 Allergic rhinitis due to pollen: Secondary | ICD-10-CM | POA: Diagnosis not present

## 2018-11-14 MED FILL — ACCU-CHEK GUIDE STRP: 90 days supply | Qty: 100 | Fill #2

## 2018-11-15 DIAGNOSIS — J301 Allergic rhinitis due to pollen: Secondary | ICD-10-CM | POA: Diagnosis not present

## 2018-11-15 DIAGNOSIS — J3081 Allergic rhinitis due to animal (cat) (dog) hair and dander: Secondary | ICD-10-CM | POA: Diagnosis not present

## 2018-11-15 DIAGNOSIS — J3089 Other allergic rhinitis: Secondary | ICD-10-CM | POA: Diagnosis not present

## 2018-11-15 DIAGNOSIS — H401131 Primary open-angle glaucoma, bilateral, mild stage: Secondary | ICD-10-CM | POA: Diagnosis not present

## 2018-11-15 DIAGNOSIS — H2513 Age-related nuclear cataract, bilateral: Secondary | ICD-10-CM | POA: Diagnosis not present

## 2018-11-19 ENCOUNTER — Other Ambulatory Visit: Payer: Self-pay | Admitting: Family Medicine

## 2018-11-19 ENCOUNTER — Ambulatory Visit
Admission: RE | Admit: 2018-11-19 | Discharge: 2018-11-19 | Disposition: A | Discharge status: Home / Self Care | Payer: 59 | Source: Ambulatory Visit | Attending: Family Medicine | Admitting: Family Medicine

## 2018-11-19 ENCOUNTER — Other Ambulatory Visit (HOSPITAL_COMMUNITY)
Admission: RE | Admit: 2018-11-19 | Discharge: 2018-11-19 | Disposition: A | Discharge status: Home / Self Care | Payer: 59 | Source: Ambulatory Visit | Attending: Family Medicine | Admitting: Family Medicine

## 2018-11-19 DIAGNOSIS — I10 Essential (primary) hypertension: Secondary | ICD-10-CM | POA: Diagnosis not present

## 2018-11-19 DIAGNOSIS — Z1231 Encounter for screening mammogram for malignant neoplasm of breast: Secondary | ICD-10-CM | POA: Diagnosis not present

## 2018-11-19 DIAGNOSIS — R7303 Prediabetes: Secondary | ICD-10-CM | POA: Diagnosis not present

## 2018-11-19 DIAGNOSIS — Z124 Encounter for screening for malignant neoplasm of cervix: Secondary | ICD-10-CM | POA: Diagnosis not present

## 2018-11-19 DIAGNOSIS — E7800 Pure hypercholesterolemia, unspecified: Secondary | ICD-10-CM | POA: Diagnosis not present

## 2018-11-19 DIAGNOSIS — Z0000 Encounter for general adult medical examination without abnormal findings: Secondary | ICD-10-CM | POA: Diagnosis not present

## 2018-11-23 DIAGNOSIS — J3089 Other allergic rhinitis: Secondary | ICD-10-CM | POA: Diagnosis not present

## 2018-11-23 DIAGNOSIS — J301 Allergic rhinitis due to pollen: Secondary | ICD-10-CM | POA: Diagnosis not present

## 2018-11-23 DIAGNOSIS — J3081 Allergic rhinitis due to animal (cat) (dog) hair and dander: Secondary | ICD-10-CM | POA: Diagnosis not present

## 2018-11-23 MED FILL — MONTELUKAST SOD 10 MG TAB: 10 | 90 days supply | Qty: 90 | Fill #0

## 2018-12-05 DIAGNOSIS — J3081 Allergic rhinitis due to animal (cat) (dog) hair and dander: Secondary | ICD-10-CM | POA: Diagnosis not present

## 2018-12-05 DIAGNOSIS — J3089 Other allergic rhinitis: Secondary | ICD-10-CM | POA: Diagnosis not present

## 2018-12-05 DIAGNOSIS — J301 Allergic rhinitis due to pollen: Secondary | ICD-10-CM | POA: Diagnosis not present

## 2018-12-05 LAB — CYTOLOGY - PAP: DIAGNOSIS: NEGATIVE

## 2018-12-14 DIAGNOSIS — J3089 Other allergic rhinitis: Secondary | ICD-10-CM | POA: Diagnosis not present

## 2018-12-14 DIAGNOSIS — J301 Allergic rhinitis due to pollen: Secondary | ICD-10-CM | POA: Diagnosis not present

## 2018-12-14 DIAGNOSIS — J3081 Allergic rhinitis due to animal (cat) (dog) hair and dander: Secondary | ICD-10-CM | POA: Diagnosis not present

## 2018-12-17 MED FILL — LISINOPRIL-HCTZ 10-12.5 MG: 10-12.5 | 90 days supply | Qty: 90 | Fill #0 | Status: TO

## 2018-12-20 DIAGNOSIS — J3081 Allergic rhinitis due to animal (cat) (dog) hair and dander: Secondary | ICD-10-CM | POA: Diagnosis not present

## 2018-12-20 DIAGNOSIS — J3089 Other allergic rhinitis: Secondary | ICD-10-CM | POA: Diagnosis not present

## 2018-12-20 DIAGNOSIS — J301 Allergic rhinitis due to pollen: Secondary | ICD-10-CM | POA: Diagnosis not present

## 2018-12-26 DIAGNOSIS — J3081 Allergic rhinitis due to animal (cat) (dog) hair and dander: Secondary | ICD-10-CM | POA: Diagnosis not present

## 2018-12-26 DIAGNOSIS — J3089 Other allergic rhinitis: Secondary | ICD-10-CM | POA: Diagnosis not present

## 2018-12-26 DIAGNOSIS — J301 Allergic rhinitis due to pollen: Secondary | ICD-10-CM | POA: Diagnosis not present

## 2018-12-28 DIAGNOSIS — J301 Allergic rhinitis due to pollen: Secondary | ICD-10-CM | POA: Diagnosis not present

## 2018-12-28 DIAGNOSIS — J3081 Allergic rhinitis due to animal (cat) (dog) hair and dander: Secondary | ICD-10-CM | POA: Diagnosis not present

## 2018-12-28 DIAGNOSIS — J3089 Other allergic rhinitis: Secondary | ICD-10-CM | POA: Diagnosis not present

## 2019-01-03 DIAGNOSIS — J3081 Allergic rhinitis due to animal (cat) (dog) hair and dander: Secondary | ICD-10-CM | POA: Diagnosis not present

## 2019-01-03 DIAGNOSIS — J301 Allergic rhinitis due to pollen: Secondary | ICD-10-CM | POA: Diagnosis not present

## 2019-01-03 DIAGNOSIS — J3089 Other allergic rhinitis: Secondary | ICD-10-CM | POA: Diagnosis not present

## 2019-01-10 DIAGNOSIS — J3081 Allergic rhinitis due to animal (cat) (dog) hair and dander: Secondary | ICD-10-CM | POA: Diagnosis not present

## 2019-01-10 DIAGNOSIS — J301 Allergic rhinitis due to pollen: Secondary | ICD-10-CM | POA: Diagnosis not present

## 2019-01-10 DIAGNOSIS — J3089 Other allergic rhinitis: Secondary | ICD-10-CM | POA: Diagnosis not present

## 2019-01-10 MED FILL — OLOPATADINE HCL 0.2% EYE DR: 0.2 | 25 days supply | Qty: 3 | Fill #2

## 2019-01-10 MED FILL — SM ALCOHOL 70% PREP PADS: 70 | 90 days supply | Qty: 100 | Fill #2

## 2019-01-10 MED FILL — TRAVOPROST (BAK FREE) 0.004: 0.004 | 90 days supply | Qty: 8 | Fill #2

## 2019-01-17 DIAGNOSIS — J3081 Allergic rhinitis due to animal (cat) (dog) hair and dander: Secondary | ICD-10-CM | POA: Diagnosis not present

## 2019-01-17 DIAGNOSIS — J301 Allergic rhinitis due to pollen: Secondary | ICD-10-CM | POA: Diagnosis not present

## 2019-01-17 DIAGNOSIS — J3089 Other allergic rhinitis: Secondary | ICD-10-CM | POA: Diagnosis not present

## 2019-01-17 MED FILL — ADVAIR 100/50 DISKUS: 100-50 | 60 days supply | Qty: 120 | Fill #0

## 2019-01-22 MED FILL — DESLORATADINE 5 MG TAB: 5 | 30 days supply | Qty: 30 | Fill #0

## 2019-01-28 DIAGNOSIS — J301 Allergic rhinitis due to pollen: Secondary | ICD-10-CM | POA: Diagnosis not present

## 2019-01-28 DIAGNOSIS — J3089 Other allergic rhinitis: Secondary | ICD-10-CM | POA: Diagnosis not present

## 2019-01-28 DIAGNOSIS — J3081 Allergic rhinitis due to animal (cat) (dog) hair and dander: Secondary | ICD-10-CM | POA: Diagnosis not present

## 2019-02-08 DIAGNOSIS — J301 Allergic rhinitis due to pollen: Secondary | ICD-10-CM | POA: Diagnosis not present

## 2019-02-08 DIAGNOSIS — J3089 Other allergic rhinitis: Secondary | ICD-10-CM | POA: Diagnosis not present

## 2019-02-08 DIAGNOSIS — J3081 Allergic rhinitis due to animal (cat) (dog) hair and dander: Secondary | ICD-10-CM | POA: Diagnosis not present

## 2019-02-13 DIAGNOSIS — J3081 Allergic rhinitis due to animal (cat) (dog) hair and dander: Secondary | ICD-10-CM | POA: Diagnosis not present

## 2019-02-13 DIAGNOSIS — J3089 Other allergic rhinitis: Secondary | ICD-10-CM | POA: Diagnosis not present

## 2019-02-13 DIAGNOSIS — J301 Allergic rhinitis due to pollen: Secondary | ICD-10-CM | POA: Diagnosis not present

## 2019-02-15 MED FILL — ACCU-CHEK GUIDE TEST STRIP: 90 days supply | Qty: 100 | Fill #0

## 2019-02-18 MED FILL — MONTELUKAST SOD 10 MG TAB: 10 | 90 days supply | Qty: 90 | Fill #0

## 2019-02-20 DIAGNOSIS — J301 Allergic rhinitis due to pollen: Secondary | ICD-10-CM | POA: Diagnosis not present

## 2019-02-20 DIAGNOSIS — J3081 Allergic rhinitis due to animal (cat) (dog) hair and dander: Secondary | ICD-10-CM | POA: Diagnosis not present

## 2019-02-20 DIAGNOSIS — J4530 Mild persistent asthma, uncomplicated: Secondary | ICD-10-CM | POA: Diagnosis not present

## 2019-02-20 DIAGNOSIS — J3089 Other allergic rhinitis: Secondary | ICD-10-CM | POA: Diagnosis not present

## 2019-02-20 MED FILL — AZELASTINE HCL 137 MCG SPRY: 0.1 | 50 days supply | Qty: 30 | Fill #0

## 2019-02-20 MED FILL — DESLORATADINE 5 MG TAB: 5 | 90 days supply | Qty: 90 | Fill #0

## 2019-02-28 DIAGNOSIS — J301 Allergic rhinitis due to pollen: Secondary | ICD-10-CM | POA: Diagnosis not present

## 2019-02-28 DIAGNOSIS — J3081 Allergic rhinitis due to animal (cat) (dog) hair and dander: Secondary | ICD-10-CM | POA: Diagnosis not present

## 2019-02-28 DIAGNOSIS — J3089 Other allergic rhinitis: Secondary | ICD-10-CM | POA: Diagnosis not present

## 2019-03-06 DIAGNOSIS — J3081 Allergic rhinitis due to animal (cat) (dog) hair and dander: Secondary | ICD-10-CM | POA: Diagnosis not present

## 2019-03-06 DIAGNOSIS — J301 Allergic rhinitis due to pollen: Secondary | ICD-10-CM | POA: Diagnosis not present

## 2019-03-06 DIAGNOSIS — J3089 Other allergic rhinitis: Secondary | ICD-10-CM | POA: Diagnosis not present

## 2019-03-11 MED FILL — LISINOPRIL-HCTZ 10-12.5 MG: 10-12.5 | 90 days supply | Qty: 90 | Fill #0

## 2019-03-14 DIAGNOSIS — J3089 Other allergic rhinitis: Secondary | ICD-10-CM | POA: Diagnosis not present

## 2019-03-14 DIAGNOSIS — J3081 Allergic rhinitis due to animal (cat) (dog) hair and dander: Secondary | ICD-10-CM | POA: Diagnosis not present

## 2019-03-14 DIAGNOSIS — J301 Allergic rhinitis due to pollen: Secondary | ICD-10-CM | POA: Diagnosis not present

## 2019-03-18 DIAGNOSIS — J301 Allergic rhinitis due to pollen: Secondary | ICD-10-CM | POA: Diagnosis not present

## 2019-03-18 DIAGNOSIS — J3081 Allergic rhinitis due to animal (cat) (dog) hair and dander: Secondary | ICD-10-CM | POA: Diagnosis not present

## 2019-03-18 DIAGNOSIS — J3089 Other allergic rhinitis: Secondary | ICD-10-CM | POA: Diagnosis not present

## 2019-03-29 DIAGNOSIS — J3081 Allergic rhinitis due to animal (cat) (dog) hair and dander: Secondary | ICD-10-CM | POA: Diagnosis not present

## 2019-03-29 DIAGNOSIS — J301 Allergic rhinitis due to pollen: Secondary | ICD-10-CM | POA: Diagnosis not present

## 2019-03-29 DIAGNOSIS — J3089 Other allergic rhinitis: Secondary | ICD-10-CM | POA: Diagnosis not present

## 2019-04-03 DIAGNOSIS — J3081 Allergic rhinitis due to animal (cat) (dog) hair and dander: Secondary | ICD-10-CM | POA: Diagnosis not present

## 2019-04-03 DIAGNOSIS — J3089 Other allergic rhinitis: Secondary | ICD-10-CM | POA: Diagnosis not present

## 2019-04-03 DIAGNOSIS — J301 Allergic rhinitis due to pollen: Secondary | ICD-10-CM | POA: Diagnosis not present

## 2019-04-10 DIAGNOSIS — H524 Presbyopia: Secondary | ICD-10-CM | POA: Diagnosis not present

## 2019-04-15 DIAGNOSIS — J301 Allergic rhinitis due to pollen: Secondary | ICD-10-CM | POA: Diagnosis not present

## 2019-04-15 DIAGNOSIS — J3081 Allergic rhinitis due to animal (cat) (dog) hair and dander: Secondary | ICD-10-CM | POA: Diagnosis not present

## 2019-04-15 DIAGNOSIS — J3089 Other allergic rhinitis: Secondary | ICD-10-CM | POA: Diagnosis not present

## 2019-04-30 DIAGNOSIS — J3089 Other allergic rhinitis: Secondary | ICD-10-CM | POA: Diagnosis not present

## 2019-04-30 DIAGNOSIS — J3081 Allergic rhinitis due to animal (cat) (dog) hair and dander: Secondary | ICD-10-CM | POA: Diagnosis not present

## 2019-04-30 DIAGNOSIS — J301 Allergic rhinitis due to pollen: Secondary | ICD-10-CM | POA: Diagnosis not present

## 2019-05-09 MED FILL — MONTELUKAST SOD 10 MG TAB: 10 | 90 days supply | Qty: 90 | Fill #0

## 2019-05-09 MED FILL — ACCU-CHEK GUIDE TEST STRIP: 90 days supply | Qty: 100 | Fill #0

## 2019-05-09 MED FILL — SM ALCOHOL 70% PREP PADS: 90 days supply | Qty: 100 | Fill #0

## 2019-05-09 MED FILL — DESLORATADINE 5 MG TAB: 5 | 90 days supply | Qty: 90 | Fill #0

## 2019-05-13 DIAGNOSIS — J301 Allergic rhinitis due to pollen: Secondary | ICD-10-CM | POA: Diagnosis not present

## 2019-05-13 DIAGNOSIS — J3081 Allergic rhinitis due to animal (cat) (dog) hair and dander: Secondary | ICD-10-CM | POA: Diagnosis not present

## 2019-05-13 DIAGNOSIS — J3089 Other allergic rhinitis: Secondary | ICD-10-CM | POA: Diagnosis not present

## 2019-05-16 DIAGNOSIS — H2513 Age-related nuclear cataract, bilateral: Secondary | ICD-10-CM | POA: Diagnosis not present

## 2019-05-16 DIAGNOSIS — I10 Essential (primary) hypertension: Secondary | ICD-10-CM | POA: Diagnosis not present

## 2019-05-16 DIAGNOSIS — E7800 Pure hypercholesterolemia, unspecified: Secondary | ICD-10-CM | POA: Diagnosis not present

## 2019-05-16 DIAGNOSIS — H401131 Primary open-angle glaucoma, bilateral, mild stage: Secondary | ICD-10-CM | POA: Diagnosis not present

## 2019-05-16 DIAGNOSIS — R7303 Prediabetes: Secondary | ICD-10-CM | POA: Diagnosis not present

## 2019-05-16 MED FILL — OLOPATADINE HCL 0.2% EYE DR: 0.2 | 25 days supply | Qty: 3 | Fill #0

## 2019-05-16 MED FILL — AZELASTINE HCL 137 MCG SPRY: 0.1 | 50 days supply | Qty: 30 | Fill #0

## 2019-05-20 DIAGNOSIS — J301 Allergic rhinitis due to pollen: Secondary | ICD-10-CM | POA: Diagnosis not present

## 2019-05-21 DIAGNOSIS — J301 Allergic rhinitis due to pollen: Secondary | ICD-10-CM | POA: Diagnosis not present

## 2019-05-21 DIAGNOSIS — J3089 Other allergic rhinitis: Secondary | ICD-10-CM | POA: Diagnosis not present

## 2019-05-21 DIAGNOSIS — J3081 Allergic rhinitis due to animal (cat) (dog) hair and dander: Secondary | ICD-10-CM | POA: Diagnosis not present

## 2019-05-22 DIAGNOSIS — E7800 Pure hypercholesterolemia, unspecified: Secondary | ICD-10-CM | POA: Diagnosis not present

## 2019-05-22 DIAGNOSIS — R7303 Prediabetes: Secondary | ICD-10-CM | POA: Diagnosis not present

## 2019-05-29 DIAGNOSIS — J301 Allergic rhinitis due to pollen: Secondary | ICD-10-CM | POA: Diagnosis not present

## 2019-05-29 DIAGNOSIS — J3089 Other allergic rhinitis: Secondary | ICD-10-CM | POA: Diagnosis not present

## 2019-05-29 DIAGNOSIS — J3081 Allergic rhinitis due to animal (cat) (dog) hair and dander: Secondary | ICD-10-CM | POA: Diagnosis not present

## 2019-06-07 DIAGNOSIS — J3089 Other allergic rhinitis: Secondary | ICD-10-CM | POA: Diagnosis not present

## 2019-06-07 DIAGNOSIS — J301 Allergic rhinitis due to pollen: Secondary | ICD-10-CM | POA: Diagnosis not present

## 2019-06-07 DIAGNOSIS — J3081 Allergic rhinitis due to animal (cat) (dog) hair and dander: Secondary | ICD-10-CM | POA: Diagnosis not present

## 2019-06-10 DIAGNOSIS — J3089 Other allergic rhinitis: Secondary | ICD-10-CM | POA: Diagnosis not present

## 2019-06-10 DIAGNOSIS — J301 Allergic rhinitis due to pollen: Secondary | ICD-10-CM | POA: Diagnosis not present

## 2019-06-10 DIAGNOSIS — J3081 Allergic rhinitis due to animal (cat) (dog) hair and dander: Secondary | ICD-10-CM | POA: Diagnosis not present

## 2019-06-13 MED FILL — LISINOPRIL-HCTZ 10-12.5 MG: 10-12.5 | 90 days supply | Qty: 90 | Fill #0

## 2019-06-17 DIAGNOSIS — J301 Allergic rhinitis due to pollen: Secondary | ICD-10-CM | POA: Diagnosis not present

## 2019-06-17 DIAGNOSIS — J3089 Other allergic rhinitis: Secondary | ICD-10-CM | POA: Diagnosis not present

## 2019-06-17 DIAGNOSIS — J3081 Allergic rhinitis due to animal (cat) (dog) hair and dander: Secondary | ICD-10-CM | POA: Diagnosis not present

## 2019-06-25 DIAGNOSIS — J3081 Allergic rhinitis due to animal (cat) (dog) hair and dander: Secondary | ICD-10-CM | POA: Diagnosis not present

## 2019-06-25 DIAGNOSIS — J3089 Other allergic rhinitis: Secondary | ICD-10-CM | POA: Diagnosis not present

## 2019-06-25 DIAGNOSIS — J301 Allergic rhinitis due to pollen: Secondary | ICD-10-CM | POA: Diagnosis not present

## 2019-06-28 DIAGNOSIS — J3081 Allergic rhinitis due to animal (cat) (dog) hair and dander: Secondary | ICD-10-CM | POA: Diagnosis not present

## 2019-06-28 DIAGNOSIS — J301 Allergic rhinitis due to pollen: Secondary | ICD-10-CM | POA: Diagnosis not present

## 2019-06-28 DIAGNOSIS — J3089 Other allergic rhinitis: Secondary | ICD-10-CM | POA: Diagnosis not present

## 2019-07-04 DIAGNOSIS — J3089 Other allergic rhinitis: Secondary | ICD-10-CM | POA: Diagnosis not present

## 2019-07-04 DIAGNOSIS — J3081 Allergic rhinitis due to animal (cat) (dog) hair and dander: Secondary | ICD-10-CM | POA: Diagnosis not present

## 2019-07-04 DIAGNOSIS — J301 Allergic rhinitis due to pollen: Secondary | ICD-10-CM | POA: Diagnosis not present

## 2019-07-11 DIAGNOSIS — J3089 Other allergic rhinitis: Secondary | ICD-10-CM | POA: Diagnosis not present

## 2019-07-11 DIAGNOSIS — J301 Allergic rhinitis due to pollen: Secondary | ICD-10-CM | POA: Diagnosis not present

## 2019-07-11 DIAGNOSIS — J3081 Allergic rhinitis due to animal (cat) (dog) hair and dander: Secondary | ICD-10-CM | POA: Diagnosis not present

## 2019-07-16 MED FILL — TRAVOPROST (BAK FREE) 0.004: 0.004 | 75 days supply | Qty: 8 | Fill #0

## 2019-07-18 DIAGNOSIS — J3089 Other allergic rhinitis: Secondary | ICD-10-CM | POA: Diagnosis not present

## 2019-07-18 DIAGNOSIS — J3081 Allergic rhinitis due to animal (cat) (dog) hair and dander: Secondary | ICD-10-CM | POA: Diagnosis not present

## 2019-07-18 DIAGNOSIS — J301 Allergic rhinitis due to pollen: Secondary | ICD-10-CM | POA: Diagnosis not present

## 2019-07-19 MED FILL — ADVAIR 100/50 DISKUS: 100-50 | 30 days supply | Qty: 60 | Fill #0

## 2019-07-26 DIAGNOSIS — J3089 Other allergic rhinitis: Secondary | ICD-10-CM | POA: Diagnosis not present

## 2019-07-26 DIAGNOSIS — J301 Allergic rhinitis due to pollen: Secondary | ICD-10-CM | POA: Diagnosis not present

## 2019-07-26 DIAGNOSIS — J3081 Allergic rhinitis due to animal (cat) (dog) hair and dander: Secondary | ICD-10-CM | POA: Diagnosis not present

## 2019-08-05 DIAGNOSIS — J3081 Allergic rhinitis due to animal (cat) (dog) hair and dander: Secondary | ICD-10-CM | POA: Diagnosis not present

## 2019-08-05 DIAGNOSIS — J3089 Other allergic rhinitis: Secondary | ICD-10-CM | POA: Diagnosis not present

## 2019-08-05 DIAGNOSIS — J301 Allergic rhinitis due to pollen: Secondary | ICD-10-CM | POA: Diagnosis not present

## 2019-08-12 MED FILL — ACCU-CHEK GUIDE TEST STRIP: 90 days supply | Qty: 100 | Fill #1

## 2019-08-12 MED FILL — SM ALCOHOL 70% PREP PADS: 90 days supply | Qty: 100 | Fill #1

## 2019-08-13 DIAGNOSIS — J3089 Other allergic rhinitis: Secondary | ICD-10-CM | POA: Diagnosis not present

## 2019-08-13 DIAGNOSIS — J301 Allergic rhinitis due to pollen: Secondary | ICD-10-CM | POA: Diagnosis not present

## 2019-08-13 DIAGNOSIS — J3081 Allergic rhinitis due to animal (cat) (dog) hair and dander: Secondary | ICD-10-CM | POA: Diagnosis not present

## 2019-08-21 DIAGNOSIS — J3081 Allergic rhinitis due to animal (cat) (dog) hair and dander: Secondary | ICD-10-CM | POA: Diagnosis not present

## 2019-08-21 DIAGNOSIS — J301 Allergic rhinitis due to pollen: Secondary | ICD-10-CM | POA: Diagnosis not present

## 2019-08-21 DIAGNOSIS — J3089 Other allergic rhinitis: Secondary | ICD-10-CM | POA: Diagnosis not present

## 2019-08-28 DIAGNOSIS — J4530 Mild persistent asthma, uncomplicated: Secondary | ICD-10-CM | POA: Diagnosis not present

## 2019-08-28 DIAGNOSIS — J301 Allergic rhinitis due to pollen: Secondary | ICD-10-CM | POA: Diagnosis not present

## 2019-08-28 DIAGNOSIS — J3089 Other allergic rhinitis: Secondary | ICD-10-CM | POA: Diagnosis not present

## 2019-08-28 DIAGNOSIS — J3081 Allergic rhinitis due to animal (cat) (dog) hair and dander: Secondary | ICD-10-CM | POA: Diagnosis not present

## 2019-09-04 DIAGNOSIS — J3089 Other allergic rhinitis: Secondary | ICD-10-CM | POA: Diagnosis not present

## 2019-09-04 DIAGNOSIS — J3081 Allergic rhinitis due to animal (cat) (dog) hair and dander: Secondary | ICD-10-CM | POA: Diagnosis not present

## 2019-09-04 DIAGNOSIS — J301 Allergic rhinitis due to pollen: Secondary | ICD-10-CM | POA: Diagnosis not present

## 2019-09-05 MED FILL — MONTELUKAST SOD 10 MG TAB: 10 | 90 days supply | Qty: 90 | Fill #1

## 2019-09-05 MED FILL — DESLORATADINE 5 MG TAB: 5 | 90 days supply | Qty: 90 | Fill #1

## 2019-09-05 MED FILL — AZELASTINE HCL 137 MCG SPRY: 0.1 | 50 days supply | Qty: 30 | Fill #1

## 2019-09-05 MED FILL — LISINOPRIL-HCTZ 10-12.5 MG: 10-12.5 | 90 days supply | Qty: 90 | Fill #1

## 2019-09-11 DIAGNOSIS — J301 Allergic rhinitis due to pollen: Secondary | ICD-10-CM | POA: Diagnosis not present

## 2019-09-11 DIAGNOSIS — J3081 Allergic rhinitis due to animal (cat) (dog) hair and dander: Secondary | ICD-10-CM | POA: Diagnosis not present

## 2019-09-11 DIAGNOSIS — J3089 Other allergic rhinitis: Secondary | ICD-10-CM | POA: Diagnosis not present

## 2019-09-16 DIAGNOSIS — J3089 Other allergic rhinitis: Secondary | ICD-10-CM | POA: Diagnosis not present

## 2019-09-16 DIAGNOSIS — J301 Allergic rhinitis due to pollen: Secondary | ICD-10-CM | POA: Diagnosis not present

## 2019-09-16 DIAGNOSIS — J3081 Allergic rhinitis due to animal (cat) (dog) hair and dander: Secondary | ICD-10-CM | POA: Diagnosis not present

## 2019-09-16 MED FILL — ADVAIR 100/50 DISKUS: 100-50 | 30 days supply | Qty: 60 | Fill #1

## 2019-09-24 DIAGNOSIS — J3089 Other allergic rhinitis: Secondary | ICD-10-CM | POA: Diagnosis not present

## 2019-09-24 DIAGNOSIS — J3081 Allergic rhinitis due to animal (cat) (dog) hair and dander: Secondary | ICD-10-CM | POA: Diagnosis not present

## 2019-09-24 DIAGNOSIS — J301 Allergic rhinitis due to pollen: Secondary | ICD-10-CM | POA: Diagnosis not present

## 2019-10-07 DIAGNOSIS — J301 Allergic rhinitis due to pollen: Secondary | ICD-10-CM | POA: Diagnosis not present

## 2019-10-07 DIAGNOSIS — J3089 Other allergic rhinitis: Secondary | ICD-10-CM | POA: Diagnosis not present

## 2019-10-07 DIAGNOSIS — J3081 Allergic rhinitis due to animal (cat) (dog) hair and dander: Secondary | ICD-10-CM | POA: Diagnosis not present

## 2019-10-15 DIAGNOSIS — J3089 Other allergic rhinitis: Secondary | ICD-10-CM | POA: Diagnosis not present

## 2019-10-15 DIAGNOSIS — J3081 Allergic rhinitis due to animal (cat) (dog) hair and dander: Secondary | ICD-10-CM | POA: Diagnosis not present

## 2019-10-15 DIAGNOSIS — J301 Allergic rhinitis due to pollen: Secondary | ICD-10-CM | POA: Diagnosis not present

## 2019-10-15 MED FILL — ADVAIR 100/50 DISKUS: 100-50 | 30 days supply | Qty: 60 | Fill #2

## 2019-10-22 ENCOUNTER — Other Ambulatory Visit: Payer: Self-pay | Admitting: Family Medicine

## 2019-10-22 DIAGNOSIS — J301 Allergic rhinitis due to pollen: Secondary | ICD-10-CM | POA: Diagnosis not present

## 2019-10-22 DIAGNOSIS — J3081 Allergic rhinitis due to animal (cat) (dog) hair and dander: Secondary | ICD-10-CM | POA: Diagnosis not present

## 2019-10-22 DIAGNOSIS — Z1231 Encounter for screening mammogram for malignant neoplasm of breast: Secondary | ICD-10-CM

## 2019-10-22 DIAGNOSIS — J3089 Other allergic rhinitis: Secondary | ICD-10-CM | POA: Diagnosis not present

## 2019-11-04 MED FILL — FLUTICASONE PROP 50 MCG SPR: 50 | 30 days supply | Qty: 16 | Fill #0

## 2019-11-05 DIAGNOSIS — J3081 Allergic rhinitis due to animal (cat) (dog) hair and dander: Secondary | ICD-10-CM | POA: Diagnosis not present

## 2019-11-05 DIAGNOSIS — J301 Allergic rhinitis due to pollen: Secondary | ICD-10-CM | POA: Diagnosis not present

## 2019-11-05 DIAGNOSIS — J3089 Other allergic rhinitis: Secondary | ICD-10-CM | POA: Diagnosis not present

## 2019-11-05 MED FILL — SM ALCOHOL 70% PREP PADS: 90 days supply | Qty: 100 | Fill #2

## 2019-11-05 MED FILL — ACCU-CHEK GUIDE STRP: 90 days supply | Qty: 100 | Fill #2

## 2019-11-06 DIAGNOSIS — J301 Allergic rhinitis due to pollen: Secondary | ICD-10-CM | POA: Diagnosis not present

## 2019-11-07 DIAGNOSIS — J3089 Other allergic rhinitis: Secondary | ICD-10-CM | POA: Diagnosis not present

## 2019-11-07 DIAGNOSIS — J3081 Allergic rhinitis due to animal (cat) (dog) hair and dander: Secondary | ICD-10-CM | POA: Diagnosis not present

## 2019-11-12 MED FILL — TRAVOPROST (BAK FREE) 0.004: 0.004 | 75 days supply | Qty: 8 | Fill #1

## 2019-11-14 DIAGNOSIS — J3089 Other allergic rhinitis: Secondary | ICD-10-CM | POA: Diagnosis not present

## 2019-11-14 DIAGNOSIS — J3081 Allergic rhinitis due to animal (cat) (dog) hair and dander: Secondary | ICD-10-CM | POA: Diagnosis not present

## 2019-11-14 DIAGNOSIS — J301 Allergic rhinitis due to pollen: Secondary | ICD-10-CM | POA: Diagnosis not present

## 2019-11-14 MED FILL — EPINEPHRINE 0.3 MG AUTO-INJ: 0.3 | 2 days supply | Qty: 2 | Fill #0

## 2019-11-18 DIAGNOSIS — J3081 Allergic rhinitis due to animal (cat) (dog) hair and dander: Secondary | ICD-10-CM | POA: Diagnosis not present

## 2019-11-18 DIAGNOSIS — J3089 Other allergic rhinitis: Secondary | ICD-10-CM | POA: Diagnosis not present

## 2019-11-18 DIAGNOSIS — J301 Allergic rhinitis due to pollen: Secondary | ICD-10-CM | POA: Diagnosis not present

## 2019-11-18 MED FILL — ADVAIR 100/50 DISKUS: 100-50 | 30 days supply | Qty: 60 | Fill #3

## 2019-11-18 MED FILL — AZELASTINE HCL 137 MCG SPRY: 0.1 | 50 days supply | Qty: 30 | Fill #2

## 2019-11-21 DIAGNOSIS — E7800 Pure hypercholesterolemia, unspecified: Secondary | ICD-10-CM | POA: Diagnosis not present

## 2019-11-21 DIAGNOSIS — R7303 Prediabetes: Secondary | ICD-10-CM | POA: Diagnosis not present

## 2019-11-21 DIAGNOSIS — Z0000 Encounter for general adult medical examination without abnormal findings: Secondary | ICD-10-CM | POA: Diagnosis not present

## 2019-11-21 DIAGNOSIS — I10 Essential (primary) hypertension: Secondary | ICD-10-CM | POA: Diagnosis not present

## 2019-11-28 DIAGNOSIS — E7800 Pure hypercholesterolemia, unspecified: Secondary | ICD-10-CM | POA: Diagnosis not present

## 2019-11-28 DIAGNOSIS — R7303 Prediabetes: Secondary | ICD-10-CM | POA: Diagnosis not present

## 2019-12-02 DIAGNOSIS — J301 Allergic rhinitis due to pollen: Secondary | ICD-10-CM | POA: Diagnosis not present

## 2019-12-02 DIAGNOSIS — J3089 Other allergic rhinitis: Secondary | ICD-10-CM | POA: Diagnosis not present

## 2019-12-02 DIAGNOSIS — J3081 Allergic rhinitis due to animal (cat) (dog) hair and dander: Secondary | ICD-10-CM | POA: Diagnosis not present

## 2019-12-05 DIAGNOSIS — J3089 Other allergic rhinitis: Secondary | ICD-10-CM | POA: Diagnosis not present

## 2019-12-05 DIAGNOSIS — J3081 Allergic rhinitis due to animal (cat) (dog) hair and dander: Secondary | ICD-10-CM | POA: Diagnosis not present

## 2019-12-09 MED FILL — MONTELUKAST SOD 10 MG TAB: 10 | 90 days supply | Qty: 90 | Fill #2

## 2019-12-09 MED FILL — DESLORATADINE 5 MG TAB: 5 | 90 days supply | Qty: 90 | Fill #0

## 2019-12-10 ENCOUNTER — Ambulatory Visit
Admission: RE | Admit: 2019-12-10 | Discharge: 2019-12-10 | Disposition: A | Discharge status: Home / Self Care | Payer: 59 | Source: Ambulatory Visit | Attending: Family Medicine | Admitting: Family Medicine

## 2019-12-10 ENCOUNTER — Other Ambulatory Visit: Payer: Self-pay

## 2019-12-10 DIAGNOSIS — J3089 Other allergic rhinitis: Secondary | ICD-10-CM | POA: Diagnosis not present

## 2019-12-10 DIAGNOSIS — J3081 Allergic rhinitis due to animal (cat) (dog) hair and dander: Secondary | ICD-10-CM | POA: Diagnosis not present

## 2019-12-10 DIAGNOSIS — J301 Allergic rhinitis due to pollen: Secondary | ICD-10-CM | POA: Diagnosis not present

## 2019-12-10 DIAGNOSIS — Z1231 Encounter for screening mammogram for malignant neoplasm of breast: Secondary | ICD-10-CM

## 2019-12-10 MED FILL — LISINOPRIL-HCTZ 10-12.5 MG: 10-12.5 | 90 days supply | Qty: 90 | Fill #0

## 2019-12-10 MED FILL — AMOXICILLIN 500 MG CAPSULE: 500 | 7 days supply | Qty: 21 | Fill #0

## 2019-12-12 DIAGNOSIS — J3081 Allergic rhinitis due to animal (cat) (dog) hair and dander: Secondary | ICD-10-CM | POA: Diagnosis not present

## 2019-12-12 DIAGNOSIS — J301 Allergic rhinitis due to pollen: Secondary | ICD-10-CM | POA: Diagnosis not present

## 2019-12-12 DIAGNOSIS — J3089 Other allergic rhinitis: Secondary | ICD-10-CM | POA: Diagnosis not present

## 2019-12-17 MED FILL — ADVAIR 100/50 DISKUS: 100-50 | 30 days supply | Qty: 60 | Fill #4

## 2019-12-19 DIAGNOSIS — J301 Allergic rhinitis due to pollen: Secondary | ICD-10-CM | POA: Diagnosis not present

## 2019-12-19 DIAGNOSIS — J3089 Other allergic rhinitis: Secondary | ICD-10-CM | POA: Diagnosis not present

## 2019-12-19 DIAGNOSIS — J3081 Allergic rhinitis due to animal (cat) (dog) hair and dander: Secondary | ICD-10-CM | POA: Diagnosis not present

## 2019-12-23 MED FILL — OMRON 3 SERIES BP MONITOR D: 30 days supply | Qty: 1 | Fill #0

## 2019-12-24 DIAGNOSIS — M272 Inflammatory conditions of jaws: Secondary | ICD-10-CM | POA: Diagnosis not present

## 2019-12-25 DIAGNOSIS — H2513 Age-related nuclear cataract, bilateral: Secondary | ICD-10-CM | POA: Diagnosis not present

## 2019-12-25 DIAGNOSIS — J3089 Other allergic rhinitis: Secondary | ICD-10-CM | POA: Diagnosis not present

## 2019-12-25 DIAGNOSIS — J301 Allergic rhinitis due to pollen: Secondary | ICD-10-CM | POA: Diagnosis not present

## 2019-12-25 DIAGNOSIS — H401131 Primary open-angle glaucoma, bilateral, mild stage: Secondary | ICD-10-CM | POA: Diagnosis not present

## 2019-12-25 DIAGNOSIS — J3081 Allergic rhinitis due to animal (cat) (dog) hair and dander: Secondary | ICD-10-CM | POA: Diagnosis not present

## 2020-01-02 DIAGNOSIS — J3089 Other allergic rhinitis: Secondary | ICD-10-CM | POA: Diagnosis not present

## 2020-01-02 DIAGNOSIS — J301 Allergic rhinitis due to pollen: Secondary | ICD-10-CM | POA: Diagnosis not present

## 2020-01-02 DIAGNOSIS — J3081 Allergic rhinitis due to animal (cat) (dog) hair and dander: Secondary | ICD-10-CM | POA: Diagnosis not present

## 2020-01-09 DIAGNOSIS — J3081 Allergic rhinitis due to animal (cat) (dog) hair and dander: Secondary | ICD-10-CM | POA: Diagnosis not present

## 2020-01-09 DIAGNOSIS — J301 Allergic rhinitis due to pollen: Secondary | ICD-10-CM | POA: Diagnosis not present

## 2020-01-09 DIAGNOSIS — J3089 Other allergic rhinitis: Secondary | ICD-10-CM | POA: Diagnosis not present

## 2020-01-14 MED FILL — ADVAIR 100/50 DISKUS: 100-50 | 90 days supply | Qty: 180 | Fill #5

## 2020-01-15 DIAGNOSIS — J301 Allergic rhinitis due to pollen: Secondary | ICD-10-CM | POA: Diagnosis not present

## 2020-01-15 DIAGNOSIS — J3089 Other allergic rhinitis: Secondary | ICD-10-CM | POA: Diagnosis not present

## 2020-01-15 DIAGNOSIS — J3081 Allergic rhinitis due to animal (cat) (dog) hair and dander: Secondary | ICD-10-CM | POA: Diagnosis not present

## 2020-01-15 MED FILL — AZELASTINE HCL 137 MCG SPRY: 0.1 | 30 days supply | Qty: 30 | Fill #0

## 2020-01-24 DIAGNOSIS — J301 Allergic rhinitis due to pollen: Secondary | ICD-10-CM | POA: Diagnosis not present

## 2020-01-24 DIAGNOSIS — J3089 Other allergic rhinitis: Secondary | ICD-10-CM | POA: Diagnosis not present

## 2020-01-24 DIAGNOSIS — J3081 Allergic rhinitis due to animal (cat) (dog) hair and dander: Secondary | ICD-10-CM | POA: Diagnosis not present

## 2020-01-27 MED FILL — FREESTYLE LITE METER: 30 days supply | Qty: 1 | Fill #0

## 2020-01-27 MED FILL — FREESTYLE LANCETS: 90 days supply | Qty: 100 | Fill #0

## 2020-01-27 MED FILL — FREESTYLE LITE TEST STRIP: 50 days supply | Qty: 50 | Fill #0

## 2020-01-29 MED FILL — FLUTICASONE PROP 50 MCG SPR: 50 | 30 days supply | Qty: 16 | Fill #1

## 2020-01-30 DIAGNOSIS — J3089 Other allergic rhinitis: Secondary | ICD-10-CM | POA: Diagnosis not present

## 2020-01-30 DIAGNOSIS — J301 Allergic rhinitis due to pollen: Secondary | ICD-10-CM | POA: Diagnosis not present

## 2020-01-30 DIAGNOSIS — J3081 Allergic rhinitis due to animal (cat) (dog) hair and dander: Secondary | ICD-10-CM | POA: Diagnosis not present

## 2020-02-06 DIAGNOSIS — J301 Allergic rhinitis due to pollen: Secondary | ICD-10-CM | POA: Diagnosis not present

## 2020-02-06 DIAGNOSIS — J3081 Allergic rhinitis due to animal (cat) (dog) hair and dander: Secondary | ICD-10-CM | POA: Diagnosis not present

## 2020-02-06 DIAGNOSIS — J3089 Other allergic rhinitis: Secondary | ICD-10-CM | POA: Diagnosis not present

## 2020-02-13 DIAGNOSIS — J301 Allergic rhinitis due to pollen: Secondary | ICD-10-CM | POA: Diagnosis not present

## 2020-02-13 DIAGNOSIS — J3089 Other allergic rhinitis: Secondary | ICD-10-CM | POA: Diagnosis not present

## 2020-02-13 DIAGNOSIS — J3081 Allergic rhinitis due to animal (cat) (dog) hair and dander: Secondary | ICD-10-CM | POA: Diagnosis not present

## 2020-02-20 DIAGNOSIS — J301 Allergic rhinitis due to pollen: Secondary | ICD-10-CM | POA: Diagnosis not present

## 2020-02-20 DIAGNOSIS — J3081 Allergic rhinitis due to animal (cat) (dog) hair and dander: Secondary | ICD-10-CM | POA: Diagnosis not present

## 2020-02-20 DIAGNOSIS — J3089 Other allergic rhinitis: Secondary | ICD-10-CM | POA: Diagnosis not present

## 2020-02-26 DIAGNOSIS — J3081 Allergic rhinitis due to animal (cat) (dog) hair and dander: Secondary | ICD-10-CM | POA: Diagnosis not present

## 2020-02-26 DIAGNOSIS — J3089 Other allergic rhinitis: Secondary | ICD-10-CM | POA: Diagnosis not present

## 2020-02-26 DIAGNOSIS — J301 Allergic rhinitis due to pollen: Secondary | ICD-10-CM | POA: Diagnosis not present

## 2020-03-02 MED FILL — AZELASTINE HCL 137 MCG SPRY: 0.1 | 30 days supply | Qty: 30 | Fill #1

## 2020-03-02 MED FILL — LISINOPRIL-HCTZ 10-12.5 MG: 10-12.5 | 90 days supply | Qty: 90 | Fill #1

## 2020-03-02 MED FILL — DESLORATADINE 5 MG TAB: 5 | 90 days supply | Qty: 90 | Fill #0

## 2020-03-02 MED FILL — MONTELUKAST SOD 10 MG TAB: 10 | 90 days supply | Qty: 90 | Fill #0

## 2020-03-03 MED FILL — SM ALCOHOL 70% PREP PADS: 90 days supply | Qty: 100 | Fill #0

## 2020-03-10 DIAGNOSIS — J3089 Other allergic rhinitis: Secondary | ICD-10-CM | POA: Diagnosis not present

## 2020-03-10 DIAGNOSIS — J3081 Allergic rhinitis due to animal (cat) (dog) hair and dander: Secondary | ICD-10-CM | POA: Diagnosis not present

## 2020-03-10 DIAGNOSIS — J301 Allergic rhinitis due to pollen: Secondary | ICD-10-CM | POA: Diagnosis not present

## 2020-03-12 MED FILL — OLOPATADINE HCL 0.2 % SOLN: 0.2 | 25 days supply | Qty: 3 | Fill #1

## 2020-03-24 DIAGNOSIS — J3089 Other allergic rhinitis: Secondary | ICD-10-CM | POA: Diagnosis not present

## 2020-03-24 DIAGNOSIS — J3081 Allergic rhinitis due to animal (cat) (dog) hair and dander: Secondary | ICD-10-CM | POA: Diagnosis not present

## 2020-03-24 DIAGNOSIS — J301 Allergic rhinitis due to pollen: Secondary | ICD-10-CM | POA: Diagnosis not present

## 2020-03-26 MED FILL — TRAVOPROST (BAK FREE) 0.004: 0.004 | 75 days supply | Qty: 8 | Fill #2

## 2020-03-27 DIAGNOSIS — H524 Presbyopia: Secondary | ICD-10-CM | POA: Diagnosis not present

## 2020-03-30 MED FILL — ADVAIR 100/50 DISKUS: 100-50 | 90 days supply | Qty: 180 | Fill #0

## 2020-04-01 DIAGNOSIS — J301 Allergic rhinitis due to pollen: Secondary | ICD-10-CM | POA: Diagnosis not present

## 2020-04-01 DIAGNOSIS — J3089 Other allergic rhinitis: Secondary | ICD-10-CM | POA: Diagnosis not present

## 2020-04-01 DIAGNOSIS — J3081 Allergic rhinitis due to animal (cat) (dog) hair and dander: Secondary | ICD-10-CM | POA: Diagnosis not present

## 2020-04-07 DIAGNOSIS — J3081 Allergic rhinitis due to animal (cat) (dog) hair and dander: Secondary | ICD-10-CM | POA: Diagnosis not present

## 2020-04-07 DIAGNOSIS — J3089 Other allergic rhinitis: Secondary | ICD-10-CM | POA: Diagnosis not present

## 2020-04-07 DIAGNOSIS — J301 Allergic rhinitis due to pollen: Secondary | ICD-10-CM | POA: Diagnosis not present

## 2020-04-16 DIAGNOSIS — J3089 Other allergic rhinitis: Secondary | ICD-10-CM | POA: Diagnosis not present

## 2020-04-16 DIAGNOSIS — J301 Allergic rhinitis due to pollen: Secondary | ICD-10-CM | POA: Diagnosis not present

## 2020-04-16 DIAGNOSIS — J3081 Allergic rhinitis due to animal (cat) (dog) hair and dander: Secondary | ICD-10-CM | POA: Diagnosis not present

## 2020-04-27 DIAGNOSIS — J3081 Allergic rhinitis due to animal (cat) (dog) hair and dander: Secondary | ICD-10-CM | POA: Diagnosis not present

## 2020-04-27 DIAGNOSIS — J3089 Other allergic rhinitis: Secondary | ICD-10-CM | POA: Diagnosis not present

## 2020-04-27 DIAGNOSIS — J4530 Mild persistent asthma, uncomplicated: Secondary | ICD-10-CM | POA: Diagnosis not present

## 2020-04-27 DIAGNOSIS — J301 Allergic rhinitis due to pollen: Secondary | ICD-10-CM | POA: Diagnosis not present

## 2020-05-04 ENCOUNTER — Other Ambulatory Visit (HOSPITAL_COMMUNITY): Payer: Self-pay | Admitting: Family Medicine

## 2020-05-04 MED FILL — FREESTYLE LITE TEST STRIP: 90 days supply | Qty: 100 | Fill #0

## 2020-05-04 MED FILL — AZELASTINE HCL 137 MCG SPRY: 0.1 | 50 days supply | Qty: 30 | Fill #2

## 2020-05-13 MED FILL — OLOPATADINE HCL 0.2 % SOLN: 0.2 | 19 days supply | Qty: 3 | Fill #2

## 2020-05-14 DIAGNOSIS — J3089 Other allergic rhinitis: Secondary | ICD-10-CM | POA: Diagnosis not present

## 2020-05-14 DIAGNOSIS — J301 Allergic rhinitis due to pollen: Secondary | ICD-10-CM | POA: Diagnosis not present

## 2020-05-14 DIAGNOSIS — J3081 Allergic rhinitis due to animal (cat) (dog) hair and dander: Secondary | ICD-10-CM | POA: Diagnosis not present

## 2020-05-20 DIAGNOSIS — I10 Essential (primary) hypertension: Secondary | ICD-10-CM | POA: Diagnosis not present

## 2020-05-20 DIAGNOSIS — E7800 Pure hypercholesterolemia, unspecified: Secondary | ICD-10-CM | POA: Diagnosis not present

## 2020-05-20 DIAGNOSIS — R7303 Prediabetes: Secondary | ICD-10-CM | POA: Diagnosis not present

## 2020-05-26 ENCOUNTER — Other Ambulatory Visit (HOSPITAL_COMMUNITY): Payer: Self-pay | Admitting: Family Medicine

## 2020-05-26 MED FILL — LISINOPRIL-HCTZ 10-12.5 MG: 10-12.5 | 90 days supply | Qty: 90 | Fill #0

## 2020-05-27 DIAGNOSIS — J3081 Allergic rhinitis due to animal (cat) (dog) hair and dander: Secondary | ICD-10-CM | POA: Diagnosis not present

## 2020-05-27 DIAGNOSIS — J301 Allergic rhinitis due to pollen: Secondary | ICD-10-CM | POA: Diagnosis not present

## 2020-05-27 DIAGNOSIS — J3089 Other allergic rhinitis: Secondary | ICD-10-CM | POA: Diagnosis not present

## 2020-06-08 ENCOUNTER — Other Ambulatory Visit (HOSPITAL_COMMUNITY): Payer: Self-pay | Admitting: Allergy

## 2020-06-08 MED FILL — LISINOPRIL-HCTZ 10-12.5 MG: 10-12.5 | 90 days supply | Qty: 90 | Fill #0

## 2020-06-08 MED FILL — FLUTICASONE PROP 50 MCG SPR: 50 | 30 days supply | Qty: 16 | Fill #2

## 2020-06-08 MED FILL — MONTELUKAST SOD 10 MG TAB: 10 | 90 days supply | Qty: 90 | Fill #0

## 2020-06-11 MED FILL — AZELASTINE HCL 137 MCG SPRY: 0.1 | 50 days supply | Qty: 30 | Fill #0

## 2020-06-16 DIAGNOSIS — J301 Allergic rhinitis due to pollen: Secondary | ICD-10-CM | POA: Diagnosis not present

## 2020-06-16 DIAGNOSIS — J3081 Allergic rhinitis due to animal (cat) (dog) hair and dander: Secondary | ICD-10-CM | POA: Diagnosis not present

## 2020-06-16 DIAGNOSIS — J3089 Other allergic rhinitis: Secondary | ICD-10-CM | POA: Diagnosis not present

## 2020-06-22 DIAGNOSIS — H2513 Age-related nuclear cataract, bilateral: Secondary | ICD-10-CM | POA: Diagnosis not present

## 2020-06-22 DIAGNOSIS — H401131 Primary open-angle glaucoma, bilateral, mild stage: Secondary | ICD-10-CM | POA: Diagnosis not present

## 2020-06-23 MED FILL — DESLORATADINE 5 MG TAB: 5 | 30 days supply | Qty: 30 | Fill #0

## 2020-06-24 DIAGNOSIS — J301 Allergic rhinitis due to pollen: Secondary | ICD-10-CM | POA: Diagnosis not present

## 2020-06-29 ENCOUNTER — Other Ambulatory Visit (HOSPITAL_COMMUNITY): Payer: Self-pay | Admitting: Allergy

## 2020-07-02 DIAGNOSIS — J301 Allergic rhinitis due to pollen: Secondary | ICD-10-CM | POA: Diagnosis not present

## 2020-07-02 DIAGNOSIS — J3089 Other allergic rhinitis: Secondary | ICD-10-CM | POA: Diagnosis not present

## 2020-07-02 DIAGNOSIS — J3081 Allergic rhinitis due to animal (cat) (dog) hair and dander: Secondary | ICD-10-CM | POA: Diagnosis not present

## 2020-07-17 DIAGNOSIS — J301 Allergic rhinitis due to pollen: Secondary | ICD-10-CM | POA: Diagnosis not present

## 2020-07-17 DIAGNOSIS — J3081 Allergic rhinitis due to animal (cat) (dog) hair and dander: Secondary | ICD-10-CM | POA: Diagnosis not present

## 2020-07-17 DIAGNOSIS — J3089 Other allergic rhinitis: Secondary | ICD-10-CM | POA: Diagnosis not present

## 2020-07-22 MED FILL — LEVOCETIRIZINE 5 MG TABLET: 5 | 30 days supply | Qty: 30 | Fill #0

## 2020-08-07 DIAGNOSIS — J3081 Allergic rhinitis due to animal (cat) (dog) hair and dander: Secondary | ICD-10-CM | POA: Diagnosis not present

## 2020-08-07 DIAGNOSIS — J3089 Other allergic rhinitis: Secondary | ICD-10-CM | POA: Diagnosis not present

## 2020-08-07 DIAGNOSIS — J301 Allergic rhinitis due to pollen: Secondary | ICD-10-CM | POA: Diagnosis not present

## 2020-08-10 MED FILL — AZELASTINE HCL 137 MCG SPRY: 0.1 | 50 days supply | Qty: 30 | Fill #1

## 2020-08-10 MED FILL — FREESTYLE LITE TEST STRIP: 90 days supply | Qty: 100 | Fill #1

## 2020-08-17 MED FILL — LEVOCETIRIZINE 5 MG TABLET: 5 | 30 days supply | Qty: 30 | Fill #1

## 2020-08-18 DIAGNOSIS — J3081 Allergic rhinitis due to animal (cat) (dog) hair and dander: Secondary | ICD-10-CM | POA: Diagnosis not present

## 2020-08-18 DIAGNOSIS — J3089 Other allergic rhinitis: Secondary | ICD-10-CM | POA: Diagnosis not present

## 2020-08-18 DIAGNOSIS — J301 Allergic rhinitis due to pollen: Secondary | ICD-10-CM | POA: Diagnosis not present

## 2020-08-27 ENCOUNTER — Other Ambulatory Visit (HOSPITAL_COMMUNITY): Payer: Self-pay | Admitting: Ophthalmology

## 2020-08-27 MED FILL — FLUTICASONE PROP 50 MCG SPR: 50 | 30 days supply | Qty: 16 | Fill #3

## 2020-08-27 MED FILL — TRAVOPROST (BAK FREE) 0.004: 0.004 | 56 days supply | Qty: 8 | Fill #0

## 2020-09-02 MED FILL — LISINOPRIL-HCTZ 10-12.5 MG: 10-12.5 | 90 days supply | Qty: 90 | Fill #1

## 2020-09-03 ENCOUNTER — Other Ambulatory Visit: Payer: Self-pay | Admitting: Family Medicine

## 2020-09-03 DIAGNOSIS — Z1231 Encounter for screening mammogram for malignant neoplasm of breast: Secondary | ICD-10-CM

## 2020-09-04 DIAGNOSIS — J3089 Other allergic rhinitis: Secondary | ICD-10-CM | POA: Diagnosis not present

## 2020-09-04 DIAGNOSIS — J3081 Allergic rhinitis due to animal (cat) (dog) hair and dander: Secondary | ICD-10-CM | POA: Diagnosis not present

## 2020-09-04 DIAGNOSIS — J301 Allergic rhinitis due to pollen: Secondary | ICD-10-CM | POA: Diagnosis not present

## 2020-09-08 DIAGNOSIS — J3089 Other allergic rhinitis: Secondary | ICD-10-CM | POA: Diagnosis not present

## 2020-09-08 DIAGNOSIS — J3081 Allergic rhinitis due to animal (cat) (dog) hair and dander: Secondary | ICD-10-CM | POA: Diagnosis not present

## 2020-09-17 DIAGNOSIS — J3081 Allergic rhinitis due to animal (cat) (dog) hair and dander: Secondary | ICD-10-CM | POA: Diagnosis not present

## 2020-09-17 DIAGNOSIS — J3089 Other allergic rhinitis: Secondary | ICD-10-CM | POA: Diagnosis not present

## 2020-09-17 DIAGNOSIS — J301 Allergic rhinitis due to pollen: Secondary | ICD-10-CM | POA: Diagnosis not present

## 2020-09-18 MED FILL — MONTELUKAST SOD 10 MG TAB: 10 | 90 days supply | Qty: 90 | Fill #1

## 2020-09-18 MED FILL — LEVOCETIRIZINE 5 MG TABLET: 5 | 30 days supply | Qty: 30 | Fill #2

## 2020-09-28 ENCOUNTER — Other Ambulatory Visit (HOSPITAL_COMMUNITY): Payer: Self-pay | Admitting: Allergy

## 2020-09-28 DIAGNOSIS — J3089 Other allergic rhinitis: Secondary | ICD-10-CM | POA: Diagnosis not present

## 2020-09-28 DIAGNOSIS — J301 Allergic rhinitis due to pollen: Secondary | ICD-10-CM | POA: Diagnosis not present

## 2020-09-28 DIAGNOSIS — J3081 Allergic rhinitis due to animal (cat) (dog) hair and dander: Secondary | ICD-10-CM | POA: Diagnosis not present

## 2020-09-28 MED FILL — ADVAIR 100/50 DISKUS: 100-50 | 90 days supply | Qty: 180 | Fill #0

## 2020-09-28 MED FILL — AZELASTINE HCL 137 MCG SPRY: 0.1 | 50 days supply | Qty: 30 | Fill #2

## 2020-10-05 DIAGNOSIS — J3081 Allergic rhinitis due to animal (cat) (dog) hair and dander: Secondary | ICD-10-CM | POA: Diagnosis not present

## 2020-10-05 DIAGNOSIS — J3089 Other allergic rhinitis: Secondary | ICD-10-CM | POA: Diagnosis not present

## 2020-10-05 DIAGNOSIS — J301 Allergic rhinitis due to pollen: Secondary | ICD-10-CM | POA: Diagnosis not present

## 2020-10-10 ENCOUNTER — Ambulatory Visit: Payer: Self-pay | Attending: Internal Medicine

## 2020-10-10 DIAGNOSIS — Z23 Encounter for immunization: Secondary | ICD-10-CM

## 2020-10-10 NOTE — Progress Notes (Signed)
   Covid-19 Vaccination Clinic  Name:  Kristina Mckinney    MRN: [id number] DOB: [birth year]  10/10/2020  [name] was observed post Covid-19 immunization for 30 minutes based on pre-vaccination screening without incident. She was provided with Vaccine Information Sheet and instruction to access the V-Safe system.   Ms. Lung was instructed to call 911 with any severe reactions post vaccine: [place] Kitchen Difficulty breathing  . Swelling of face and throat  . A fast heartbeat  . A bad rash all over body  . Dizziness and weakness   Immunizations Administered    Name Date Dose VIS Date Route   Pfizer COVID-19 Vaccine 10/10/2020 11:14 AM 0.3 mL 08/26/2020 Intramuscular   Manufacturer: [place]   Lot: [id number]   NDC: [address]-8

## 2020-10-15 MED FILL — LEVOCETIRIZINE 5 MG TABLET: 5 | 30 days supply | Qty: 30 | Fill #3

## 2020-10-15 MED FILL — FLUTICASONE PROP 50 MCG SPR: 50 | 30 days supply | Qty: 16 | Fill #4

## 2020-10-21 DIAGNOSIS — J3081 Allergic rhinitis due to animal (cat) (dog) hair and dander: Secondary | ICD-10-CM | POA: Diagnosis not present

## 2020-10-21 DIAGNOSIS — J301 Allergic rhinitis due to pollen: Secondary | ICD-10-CM | POA: Diagnosis not present

## 2020-10-21 DIAGNOSIS — J3089 Other allergic rhinitis: Secondary | ICD-10-CM | POA: Diagnosis not present

## 2020-10-27 DIAGNOSIS — J3089 Other allergic rhinitis: Secondary | ICD-10-CM | POA: Diagnosis not present

## 2020-10-27 DIAGNOSIS — J3081 Allergic rhinitis due to animal (cat) (dog) hair and dander: Secondary | ICD-10-CM | POA: Diagnosis not present

## 2020-10-27 DIAGNOSIS — J301 Allergic rhinitis due to pollen: Secondary | ICD-10-CM | POA: Diagnosis not present

## 2020-11-02 DIAGNOSIS — J301 Allergic rhinitis due to pollen: Secondary | ICD-10-CM | POA: Diagnosis not present

## 2020-11-02 DIAGNOSIS — J3081 Allergic rhinitis due to animal (cat) (dog) hair and dander: Secondary | ICD-10-CM | POA: Diagnosis not present

## 2020-11-02 DIAGNOSIS — J3089 Other allergic rhinitis: Secondary | ICD-10-CM | POA: Diagnosis not present

## 2020-11-10 DIAGNOSIS — J301 Allergic rhinitis due to pollen: Secondary | ICD-10-CM | POA: Diagnosis not present

## 2020-11-10 DIAGNOSIS — J3089 Other allergic rhinitis: Secondary | ICD-10-CM | POA: Diagnosis not present

## 2020-11-10 DIAGNOSIS — J3081 Allergic rhinitis due to animal (cat) (dog) hair and dander: Secondary | ICD-10-CM | POA: Diagnosis not present

## 2020-11-18 ENCOUNTER — Other Ambulatory Visit (HOSPITAL_COMMUNITY): Payer: Self-pay | Admitting: Allergy

## 2020-11-18 MED FILL — FREESTYLE LITE TEST STRIP: 90 days supply | Qty: 100 | Fill #2

## 2020-11-18 MED FILL — LEVOCETIRIZINE 5 MG TABLET: 5 | 30 days supply | Qty: 30 | Fill #0

## 2020-11-18 MED FILL — AZELASTINE HCL 137 MCG SPRY: 0.1 | 50 days supply | Qty: 30 | Fill #0

## 2020-12-01 DIAGNOSIS — R7303 Prediabetes: Secondary | ICD-10-CM | POA: Diagnosis not present

## 2020-12-01 DIAGNOSIS — I10 Essential (primary) hypertension: Secondary | ICD-10-CM | POA: Diagnosis not present

## 2020-12-01 DIAGNOSIS — J301 Allergic rhinitis due to pollen: Secondary | ICD-10-CM | POA: Diagnosis not present

## 2020-12-01 DIAGNOSIS — Z0000 Encounter for general adult medical examination without abnormal findings: Secondary | ICD-10-CM | POA: Diagnosis not present

## 2020-12-01 DIAGNOSIS — J3081 Allergic rhinitis due to animal (cat) (dog) hair and dander: Secondary | ICD-10-CM | POA: Diagnosis not present

## 2020-12-01 DIAGNOSIS — Z23 Encounter for immunization: Secondary | ICD-10-CM | POA: Diagnosis not present

## 2020-12-01 DIAGNOSIS — E7800 Pure hypercholesterolemia, unspecified: Secondary | ICD-10-CM | POA: Diagnosis not present

## 2020-12-01 DIAGNOSIS — J3089 Other allergic rhinitis: Secondary | ICD-10-CM | POA: Diagnosis not present

## 2020-12-02 ENCOUNTER — Other Ambulatory Visit (HOSPITAL_COMMUNITY): Payer: Self-pay | Admitting: Family Medicine

## 2020-12-02 MED FILL — LISINOPRIL-HCTZ 10-12.5 MG: 10-12.5 | 90 days supply | Qty: 90 | Fill #0

## 2020-12-03 ENCOUNTER — Other Ambulatory Visit: Payer: Self-pay | Admitting: Family Medicine

## 2020-12-03 DIAGNOSIS — E2839 Other primary ovarian failure: Secondary | ICD-10-CM

## 2020-12-03 DIAGNOSIS — Z13820 Encounter for screening for osteoporosis: Secondary | ICD-10-CM

## 2020-12-10 ENCOUNTER — Other Ambulatory Visit: Payer: Self-pay

## 2020-12-10 ENCOUNTER — Ambulatory Visit
Admission: RE | Admit: 2020-12-10 | Discharge: 2020-12-10 | Disposition: A | Discharge status: Home / Self Care | Payer: PPO | Source: Ambulatory Visit | Attending: Family Medicine | Admitting: Family Medicine

## 2020-12-10 DIAGNOSIS — Z1231 Encounter for screening mammogram for malignant neoplasm of breast: Secondary | ICD-10-CM

## 2020-12-15 ENCOUNTER — Other Ambulatory Visit (HOSPITAL_COMMUNITY): Payer: Self-pay | Admitting: Allergy

## 2020-12-15 DIAGNOSIS — J3081 Allergic rhinitis due to animal (cat) (dog) hair and dander: Secondary | ICD-10-CM | POA: Diagnosis not present

## 2020-12-15 DIAGNOSIS — J3089 Other allergic rhinitis: Secondary | ICD-10-CM | POA: Diagnosis not present

## 2020-12-15 DIAGNOSIS — J301 Allergic rhinitis due to pollen: Secondary | ICD-10-CM | POA: Diagnosis not present

## 2020-12-15 MED FILL — MONTELUKAST SOD 10 MG TAB: 10 | 90 days supply | Qty: 90 | Fill #0

## 2020-12-15 MED FILL — LEVOCETIRIZINE 5 MG TABLET: 5 | 30 days supply | Qty: 30 | Fill #1

## 2020-12-18 ENCOUNTER — Encounter: Payer: Self-pay | Admitting: Gastroenterology

## 2020-12-31 DIAGNOSIS — J3081 Allergic rhinitis due to animal (cat) (dog) hair and dander: Secondary | ICD-10-CM | POA: Diagnosis not present

## 2020-12-31 DIAGNOSIS — H401131 Primary open-angle glaucoma, bilateral, mild stage: Secondary | ICD-10-CM | POA: Diagnosis not present

## 2020-12-31 DIAGNOSIS — E119 Type 2 diabetes mellitus without complications: Secondary | ICD-10-CM | POA: Diagnosis not present

## 2020-12-31 DIAGNOSIS — H2513 Age-related nuclear cataract, bilateral: Secondary | ICD-10-CM | POA: Diagnosis not present

## 2020-12-31 DIAGNOSIS — J3089 Other allergic rhinitis: Secondary | ICD-10-CM | POA: Diagnosis not present

## 2020-12-31 DIAGNOSIS — J301 Allergic rhinitis due to pollen: Secondary | ICD-10-CM | POA: Diagnosis not present

## 2021-01-07 ENCOUNTER — Other Ambulatory Visit (HOSPITAL_COMMUNITY): Payer: Self-pay | Admitting: Allergy

## 2021-01-07 DIAGNOSIS — J3089 Other allergic rhinitis: Secondary | ICD-10-CM | POA: Diagnosis not present

## 2021-01-07 DIAGNOSIS — J3081 Allergic rhinitis due to animal (cat) (dog) hair and dander: Secondary | ICD-10-CM | POA: Diagnosis not present

## 2021-01-07 DIAGNOSIS — J301 Allergic rhinitis due to pollen: Secondary | ICD-10-CM | POA: Diagnosis not present

## 2021-01-07 MED FILL — ADVAIR 100/50 DISKUS: 100-50 | 90 days supply | Qty: 180 | Fill #1

## 2021-01-07 MED FILL — AZELASTINE HCL 137 MCG SPRY: 0.1 | 50 days supply | Qty: 30 | Fill #1

## 2021-01-07 MED FILL — LEVOCETIRIZINE 5 MG TABLET: 5 | 30 days supply | Qty: 30 | Fill #2

## 2021-01-07 MED FILL — FLUTICASONE PROP 50 MCG SPR: 50 | 30 days supply | Qty: 16 | Fill #0

## 2021-01-12 DIAGNOSIS — J301 Allergic rhinitis due to pollen: Secondary | ICD-10-CM | POA: Diagnosis not present

## 2021-01-12 DIAGNOSIS — J3081 Allergic rhinitis due to animal (cat) (dog) hair and dander: Secondary | ICD-10-CM | POA: Diagnosis not present

## 2021-01-12 DIAGNOSIS — J3089 Other allergic rhinitis: Secondary | ICD-10-CM | POA: Diagnosis not present

## 2021-01-25 DIAGNOSIS — J3081 Allergic rhinitis due to animal (cat) (dog) hair and dander: Secondary | ICD-10-CM | POA: Diagnosis not present

## 2021-01-25 DIAGNOSIS — J301 Allergic rhinitis due to pollen: Secondary | ICD-10-CM | POA: Diagnosis not present

## 2021-01-25 DIAGNOSIS — J3089 Other allergic rhinitis: Secondary | ICD-10-CM | POA: Diagnosis not present

## 2021-01-29 ENCOUNTER — Ambulatory Visit (AMBULATORY_SURGERY_CENTER): Payer: Self-pay

## 2021-01-29 ENCOUNTER — Other Ambulatory Visit: Payer: Self-pay

## 2021-01-29 VITALS — Ht 62.0 in | Wt 162.0 lb

## 2021-01-29 DIAGNOSIS — Z86010 Personal history of colonic polyps: Secondary | ICD-10-CM

## 2021-01-29 MED ORDER — GOLYTELY 236 G PO SOLR: 4000.0000 mL | ORAL | 0 refills | Status: DC

## 2021-01-29 NOTE — Progress Notes (Signed)
No egg or soy allergy known to patient  No issues with past sedation with any surgeries or procedures Patient denies ever being told they had issues or difficulty with intubation  No FH of Malignant Hyperthermia No diet pills per patient No home 02 use per patient  No blood thinners per patient  Pt denies issues with constipation at this time; No A fib or A flutter  EMMI video via [place] 19 guidelines implemented in [place] today with Pt and RN  Discussed with pt there will be an out-of-pocket cost for prep and that varies from $0 to 70 dollars;  Due to the COVID-19 pandemic we are asking patients to follow certain guidelines.   Pt aware of COVID protocols and [place] guidelines  Patient requested to have miralax for prep-

## 2021-02-08 DIAGNOSIS — J3089 Other allergic rhinitis: Secondary | ICD-10-CM | POA: Diagnosis not present

## 2021-02-08 DIAGNOSIS — J3081 Allergic rhinitis due to animal (cat) (dog) hair and dander: Secondary | ICD-10-CM | POA: Diagnosis not present

## 2021-02-08 DIAGNOSIS — J301 Allergic rhinitis due to pollen: Secondary | ICD-10-CM | POA: Diagnosis not present

## 2021-02-09 ENCOUNTER — Encounter: Payer: Self-pay | Admitting: Gastroenterology

## 2021-02-12 ENCOUNTER — Ambulatory Visit (AMBULATORY_SURGERY_CENTER): Payer: PPO | Admitting: Gastroenterology

## 2021-02-12 ENCOUNTER — Other Ambulatory Visit: Payer: Self-pay

## 2021-02-12 ENCOUNTER — Encounter: Payer: Self-pay | Admitting: Gastroenterology

## 2021-02-12 VITALS — BP 123/71 | HR 65 | Temp 97.3°F | Resp 15 | Ht 62.0 in | Wt 162.0 lb

## 2021-02-12 DIAGNOSIS — I10 Essential (primary) hypertension: Secondary | ICD-10-CM | POA: Diagnosis not present

## 2021-02-12 DIAGNOSIS — D124 Benign neoplasm of descending colon: Secondary | ICD-10-CM | POA: Diagnosis not present

## 2021-02-12 DIAGNOSIS — Z860100 Personal history of colon polyps, unspecified: Secondary | ICD-10-CM

## 2021-02-12 DIAGNOSIS — J45909 Unspecified asthma, uncomplicated: Secondary | ICD-10-CM | POA: Diagnosis not present

## 2021-02-12 DIAGNOSIS — E119 Type 2 diabetes mellitus without complications: Secondary | ICD-10-CM | POA: Diagnosis not present

## 2021-02-12 MED ORDER — SODIUM CHLORIDE 0.9 % IV SOLN: 500.0000 mL | Freq: Once | INTRAVENOUS | Status: DC

## 2021-02-12 NOTE — Patient Instructions (Signed)
Handouts given:  Hemorrhoids, polyps, Diverticulosis Resume previous diet Continue current medications Await pathology results  YOU HAD AN ENDOSCOPIC PROCEDURE TODAY AT [facility]:   Refer to the procedure report that was given to you for any specific questions about what was found during the examination.  If the procedure report does not answer your questions, please call your gastroenterologist to clarify.  If you requested that your care partner not be given the details of your procedure findings, then the procedure report has been included in a sealed envelope for you to review at your convenience later.  YOU SHOULD EXPECT: Some feelings of bloating in the abdomen. Passage of more gas than usual.  Walking can help get rid of the air that was put into your GI tract during the procedure and reduce the bloating. If you had a lower endoscopy (such as a colonoscopy or flexible sigmoidoscopy) you may notice spotting of blood in your stool or on the toilet paper. If you underwent a bowel prep for your procedure, you may not have a normal bowel movement for a few days.  Please Note:  You might notice some irritation and congestion in your nose or some drainage.  This is from the oxygen used during your procedure.  There is no need for concern and it should clear up in a day or so.  SYMPTOMS TO REPORT IMMEDIATELY:   Following lower endoscopy (colonoscopy or flexible sigmoidoscopy):  Excessive amounts of blood in the stool  Significant tenderness or worsening of abdominal pains  Swelling of the abdomen that is new, acute  Fever of 100F or higher  For urgent or emergent issues, a gastroenterologist can be reached at any hour by calling [phone]. Do not use MyChart messaging for urgent concerns.   DIET:  We do recommend a small meal at first, but then you may proceed to your regular diet.  Drink plenty of fluids but you should avoid alcoholic beverages for 24  hours.  ACTIVITY:  You should plan to take it easy for the rest of today and you should NOT DRIVE or use heavy machinery until tomorrow (because of the sedation medicines used during the test).    FOLLOW UP: Our staff will call the number listed on your records 48-72 hours following your procedure to check on you and address any questions or concerns that you may have regarding the information given to you following your procedure. If we do not reach you, we will leave a message.  We will attempt to reach you two times.  During this call, we will ask if you have developed any symptoms of COVID 19. If you develop any symptoms (ie: fever, flu-like symptoms, shortness of breath, cough etc.) before then, please call [phone].  If you test positive for Covid 19 in the 2 weeks post procedure, please call and report this information to [place].    If any biopsies were taken you will be contacted by phone or by letter within the next 1-3 weeks.  Please call us at [phone] if you have not heard about the biopsies in 3 weeks.   SIGNATURES/CONFIDENTIALITY: You and/or your care partner have signed paperwork which will be entered into your electronic medical record.  These signatures attest to the fact that that the information above on your After Visit Summary has been reviewed and is understood.  Full responsibility of the confidentiality of this discharge information lies with you and/or your care-partner.

## 2021-02-12 NOTE — Progress Notes (Signed)
Called to room to assist during endoscopic procedure.  Patient ID and intended procedure confirmed with present staff. Received instructions for my participation in the procedure from the performing physician.  

## 2021-02-12 NOTE — Progress Notes (Signed)
Report given to PACU, vss 

## 2021-02-12 NOTE — Op Note (Signed)
[place] Patient Name: Kristina Mckinney Procedure Date: 02/12/2021 10:31 AM MRN: [id number] Endoscopist: Milus Banister , MD Age: 65 Referring MD:  Date of Birth: 19-Jan-1956 Gender: Female Account #: [id number] Procedure:                Colonoscopy Indications:              High risk colon cancer surveillance: Personal                            history of colonic polyps; Meridee Branum TVA removed                            colonoscopy 2006, 2009, 2010. 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 Colonoscopy                            2017 single subCM adenoma removed, not at HF site Medicines:                Monitored Anesthesia Care Procedure:                Pre-Anesthesia Assessment:                           - Prior to the procedure, a History and Physical                            was performed, and patient medications and                            allergies were reviewed. The patient's tolerance of                            previous anesthesia was also reviewed. The risks                            and benefits of the procedure and the sedation                            options and risks were discussed with the patient.  All questions were answered, and informed consent                            was obtained. Prior Anticoagulants: The patient has                            taken no previous anticoagulant or antiplatelet                            agents. ASA Grade Assessment: II - A patient with                            mild systemic disease. After reviewing the risks                            and benefits,  the patient was deemed in                            satisfactory condition to undergo the procedure.                           After obtaining informed consent, the colonoscope                            was passed under direct vision. Throughout the                            procedure, the patient's blood pressure, pulse, and                            oxygen saturations were monitored continuously. The                            Olympus CF-HQ190 [id number]) Colonoscope was                            introduced through the anus and advanced to the the                            cecum, identified by appendiceal orifice and                            ileocecal valve. The colonoscopy was performed                            without difficulty. The patient tolerated the                            procedure well. The quality of the bowel                            preparation was good. The ileocecal valve,  appendiceal orifice, and rectum were photographed. Scope In: 10:34:29 AM Scope Out: 10:49:47 AM Scope Withdrawal Time: 0 hours 11 minutes 29 seconds  Total Procedure Duration: 0 hours 15 minutes 18 seconds  Findings:                 The site of previous polypectomies at hepatic                            flexure was easily identified with previous [place]                            Ink tattoo, there was no recurrent polyp present.                           A 3 mm polyp was found in the descending colon. The                            polyp was sessile. The polyp was removed with a                            cold snare. Resection and retrieval were complete.                           Multiple small and large-mouthed diverticula were                            found in the left colon.                           External and internal hemorrhoids were found. The                            hemorrhoids were small.                           The exam was otherwise without  abnormality on                            direct and retroflexion views. Complications:            No immediate complications. Estimated blood loss:                            None. Estimated Blood Loss:     Estimated blood loss: none. Impression:               - The site of previous polypectomies at hepatic                            flexure was easily identified with previous [place]                            Ink tattoo, there was no recurrent polyp present.                           -  One 3 mm polyp in the descending colon, removed                            with a cold snare. Resected and retrieved.                           - Diverticulosis in the left colon.                           - External and internal hemorrhoids.                           - The examination was otherwise normal on direct                            and retroflexion views. Recommendation:           - Patient has a contact number available for                            emergencies. The signs and symptoms of potential                            delayed complications were discussed with the                            patient. Return to normal activities tomorrow.                            Written discharge instructions were provided to the                            patient.                           - Resume previous diet.                           - Continue present medications.                           - Await pathology results. Milus Banister, MD 02/12/2021 10:57:48 AM This report has been signed electronically.

## 2021-02-15 ENCOUNTER — Other Ambulatory Visit (HOSPITAL_COMMUNITY): Payer: Self-pay

## 2021-02-15 MED ORDER — LISINOPRIL-HYDROCHLOROTHIAZIDE 10-12.5 MG PO TABS
1.0000 | ORAL_TABLET | Freq: Every day | ORAL | 1 refills | Status: DC
  Filled 2021-02-15: qty 90, 90d supply, fill #0
  Filled 2021-05-21: qty 90, 90d supply, fill #1

## 2021-02-15 MED ORDER — LISINOPRIL-HYDROCHLOROTHIAZIDE 10-12.5 MG PO TABS
1.0000 | ORAL_TABLET | Freq: Every day | ORAL | 1 refills | Status: AC
  Filled 2021-02-15: qty 90, 90d supply, fill #0

## 2021-02-15 MED ORDER — LEVOCETIRIZINE DIHYDROCHLORIDE 5 MG PO TABS
5.0000 mg | ORAL_TABLET | Freq: Every evening | ORAL | 6 refills | Status: DC
  Filled 2021-02-15: qty 30, 30d supply, fill #0
  Filled 2021-03-22: qty 30, 30d supply, fill #1
  Filled 2021-04-16: qty 30, 30d supply, fill #2
  Filled 2021-05-21: qty 30, 30d supply, fill #3
  Filled 2021-06-18: qty 30, 30d supply, fill #4
  Filled 2021-07-15: qty 30, 30d supply, fill #5
  Filled 2021-08-15: qty 30, 30d supply, fill #6

## 2021-02-16 ENCOUNTER — Telehealth: Payer: Self-pay | Admitting: *Deleted

## 2021-02-16 NOTE — Telephone Encounter (Signed)
  Follow up Call-  Call back number 02/12/2021  Post procedure Call Back phone  # [phone]  Permission to leave phone message Yes  Some recent data might be hidden     Patient questions:  Do you have a fever, pain , or abdominal swelling? No. Pain Score  0 *  Have you tolerated food without any problems? Yes.    Have you been able to return to your normal activities? Yes.    Do you have any questions about your discharge instructions: Diet   No. Medications  No. Follow up visit  No.  Do you have questions or concerns about your Care? No.  Actions: * If pain score is 4 or above: No action needed, pain <4.  1. Have you developed a fever since your procedure? no  2.   Have you had an respiratory symptoms (SOB or cough) since your procedure? no  3.   Have you tested positive for COVID 19 since your procedure no  4.   Have you had any family members/close contacts diagnosed with the COVID 19 since your procedure?  no   If yes to any of these questions please route to Joylene John, RN and Joella Prince, RN

## 2021-02-23 DIAGNOSIS — J3081 Allergic rhinitis due to animal (cat) (dog) hair and dander: Secondary | ICD-10-CM | POA: Diagnosis not present

## 2021-02-23 DIAGNOSIS — J3089 Other allergic rhinitis: Secondary | ICD-10-CM | POA: Diagnosis not present

## 2021-02-23 DIAGNOSIS — J301 Allergic rhinitis due to pollen: Secondary | ICD-10-CM | POA: Diagnosis not present

## 2021-02-26 ENCOUNTER — Encounter: Payer: Self-pay | Admitting: Gastroenterology

## 2021-03-03 ENCOUNTER — Other Ambulatory Visit (HOSPITAL_COMMUNITY): Payer: Self-pay

## 2021-03-03 MED FILL — Glucose Blood Test Strip: 50 days supply | Qty: 50 | Fill #0 | Status: AC

## 2021-03-04 ENCOUNTER — Other Ambulatory Visit (HOSPITAL_COMMUNITY): Payer: Self-pay

## 2021-03-05 ENCOUNTER — Other Ambulatory Visit (HOSPITAL_COMMUNITY): Payer: Self-pay

## 2021-03-05 MED ORDER — AZELASTINE HCL 0.1 % NA SOLN
Freq: Two times a day (BID) | NASAL | 1 refills | Status: DC
  Filled 2021-03-05: qty 30, 30d supply, fill #0
  Filled 2021-03-30: qty 30, 30d supply, fill #1
  Filled 2021-04-22: qty 30, 50d supply, fill #1
  Filled 2021-05-06: qty 30, 30d supply, fill #1

## 2021-03-05 MED FILL — Travoprost Ophth Soln 0.004% (Benzalkonium Free) (BAK Free): OPHTHALMIC | 90 days supply | Qty: 7.5 | Fill #0 | Status: AC

## 2021-03-08 ENCOUNTER — Other Ambulatory Visit (HOSPITAL_COMMUNITY): Payer: Self-pay

## 2021-03-17 DIAGNOSIS — J301 Allergic rhinitis due to pollen: Secondary | ICD-10-CM | POA: Diagnosis not present

## 2021-03-17 DIAGNOSIS — J3089 Other allergic rhinitis: Secondary | ICD-10-CM | POA: Diagnosis not present

## 2021-03-17 DIAGNOSIS — J3081 Allergic rhinitis due to animal (cat) (dog) hair and dander: Secondary | ICD-10-CM | POA: Diagnosis not present

## 2021-03-22 ENCOUNTER — Other Ambulatory Visit (HOSPITAL_COMMUNITY): Payer: Self-pay

## 2021-03-22 MED FILL — Montelukast Sodium Tab 10 MG (Base Equiv): ORAL | 90 days supply | Qty: 90 | Fill #0 | Status: AC

## 2021-03-30 ENCOUNTER — Other Ambulatory Visit (HOSPITAL_COMMUNITY): Payer: Self-pay

## 2021-03-30 DIAGNOSIS — J3081 Allergic rhinitis due to animal (cat) (dog) hair and dander: Secondary | ICD-10-CM | POA: Diagnosis not present

## 2021-03-30 DIAGNOSIS — J301 Allergic rhinitis due to pollen: Secondary | ICD-10-CM | POA: Diagnosis not present

## 2021-03-30 DIAGNOSIS — J3089 Other allergic rhinitis: Secondary | ICD-10-CM | POA: Diagnosis not present

## 2021-04-01 ENCOUNTER — Other Ambulatory Visit (HOSPITAL_COMMUNITY): Payer: Self-pay

## 2021-04-01 MED ORDER — FLUTICASONE PROPIONATE 50 MCG/ACT NA SUSP: 1.0000 | Freq: Every day | NASAL | 1 refills | Status: AC

## 2021-04-13 ENCOUNTER — Other Ambulatory Visit (HOSPITAL_COMMUNITY): Payer: Self-pay

## 2021-04-14 ENCOUNTER — Other Ambulatory Visit: Payer: Self-pay

## 2021-04-14 ENCOUNTER — Ambulatory Visit
Admission: RE | Admit: 2021-04-14 | Discharge: 2021-04-14 | Disposition: A | Discharge status: Home / Self Care | Payer: PPO | Source: Ambulatory Visit | Attending: Family Medicine | Admitting: Family Medicine

## 2021-04-14 DIAGNOSIS — Z13820 Encounter for screening for osteoporosis: Secondary | ICD-10-CM

## 2021-04-14 DIAGNOSIS — J3081 Allergic rhinitis due to animal (cat) (dog) hair and dander: Secondary | ICD-10-CM | POA: Diagnosis not present

## 2021-04-14 DIAGNOSIS — E2839 Other primary ovarian failure: Secondary | ICD-10-CM

## 2021-04-14 DIAGNOSIS — J301 Allergic rhinitis due to pollen: Secondary | ICD-10-CM | POA: Diagnosis not present

## 2021-04-14 DIAGNOSIS — J3089 Other allergic rhinitis: Secondary | ICD-10-CM | POA: Diagnosis not present

## 2021-04-14 DIAGNOSIS — Z780 Asymptomatic menopausal state: Secondary | ICD-10-CM | POA: Diagnosis not present

## 2021-04-15 ENCOUNTER — Other Ambulatory Visit (HOSPITAL_COMMUNITY): Payer: Self-pay

## 2021-04-15 MED ORDER — FLUTICASONE-SALMETEROL 100-50 MCG/ACT IN AEPB
1.0000 | INHALATION_SPRAY | Freq: Two times a day (BID) | RESPIRATORY_TRACT | 0 refills | Status: DC
  Filled 2021-04-15: qty 60, 30d supply, fill #0

## 2021-04-16 ENCOUNTER — Other Ambulatory Visit (HOSPITAL_COMMUNITY): Payer: Self-pay

## 2021-04-22 ENCOUNTER — Other Ambulatory Visit (HOSPITAL_COMMUNITY): Payer: Self-pay

## 2021-04-23 DIAGNOSIS — J3089 Other allergic rhinitis: Secondary | ICD-10-CM | POA: Diagnosis not present

## 2021-04-23 DIAGNOSIS — J3081 Allergic rhinitis due to animal (cat) (dog) hair and dander: Secondary | ICD-10-CM | POA: Diagnosis not present

## 2021-04-23 DIAGNOSIS — J301 Allergic rhinitis due to pollen: Secondary | ICD-10-CM | POA: Diagnosis not present

## 2021-04-30 ENCOUNTER — Other Ambulatory Visit (HOSPITAL_COMMUNITY): Payer: Self-pay

## 2021-05-05 ENCOUNTER — Other Ambulatory Visit (HOSPITAL_COMMUNITY): Payer: Self-pay

## 2021-05-05 DIAGNOSIS — J3089 Other allergic rhinitis: Secondary | ICD-10-CM | POA: Diagnosis not present

## 2021-05-05 DIAGNOSIS — J3081 Allergic rhinitis due to animal (cat) (dog) hair and dander: Secondary | ICD-10-CM | POA: Diagnosis not present

## 2021-05-05 DIAGNOSIS — J301 Allergic rhinitis due to pollen: Secondary | ICD-10-CM | POA: Diagnosis not present

## 2021-05-06 ENCOUNTER — Other Ambulatory Visit (HOSPITAL_COMMUNITY): Payer: Self-pay

## 2021-05-07 ENCOUNTER — Other Ambulatory Visit (HOSPITAL_COMMUNITY): Payer: Self-pay

## 2021-05-07 MED ORDER — FREESTYLE LITE TEST VI STRP
ORAL_STRIP | 3 refills | Status: AC
  Filled 2021-05-07: qty 50, 50d supply, fill #0
  Filled 2021-06-25: qty 50, 50d supply, fill #1
  Filled 2021-08-15: qty 50, 50d supply, fill #2
  Filled 2021-10-03: qty 50, 50d supply, fill #3
  Filled 2021-11-25: qty 50, 50d supply, fill #4
  Filled 2022-01-10: qty 50, 50d supply, fill #5

## 2021-05-12 ENCOUNTER — Other Ambulatory Visit (HOSPITAL_COMMUNITY): Payer: Self-pay

## 2021-05-12 DIAGNOSIS — J301 Allergic rhinitis due to pollen: Secondary | ICD-10-CM | POA: Diagnosis not present

## 2021-05-12 DIAGNOSIS — J3089 Other allergic rhinitis: Secondary | ICD-10-CM | POA: Diagnosis not present

## 2021-05-12 DIAGNOSIS — J3081 Allergic rhinitis due to animal (cat) (dog) hair and dander: Secondary | ICD-10-CM | POA: Diagnosis not present

## 2021-05-12 MED ORDER — FLUTICASONE-SALMETEROL 100-50 MCG/ACT IN AEPB
1.0000 | INHALATION_SPRAY | Freq: Two times a day (BID) | RESPIRATORY_TRACT | 0 refills | Status: DC
  Filled 2021-05-12: qty 60, 30d supply, fill #0

## 2021-05-21 ENCOUNTER — Other Ambulatory Visit (HOSPITAL_COMMUNITY): Payer: Self-pay

## 2021-05-24 ENCOUNTER — Other Ambulatory Visit (HOSPITAL_COMMUNITY): Payer: Self-pay

## 2021-05-24 DIAGNOSIS — J4530 Mild persistent asthma, uncomplicated: Secondary | ICD-10-CM | POA: Diagnosis not present

## 2021-05-24 DIAGNOSIS — J3081 Allergic rhinitis due to animal (cat) (dog) hair and dander: Secondary | ICD-10-CM | POA: Diagnosis not present

## 2021-05-24 DIAGNOSIS — J3089 Other allergic rhinitis: Secondary | ICD-10-CM | POA: Diagnosis not present

## 2021-05-24 DIAGNOSIS — J301 Allergic rhinitis due to pollen: Secondary | ICD-10-CM | POA: Diagnosis not present

## 2021-05-24 MED ORDER — ALBUTEROL SULFATE HFA 108 (90 BASE) MCG/ACT IN AERS
1.0000 | INHALATION_SPRAY | RESPIRATORY_TRACT | 0 refills | Status: AC | PRN
  Filled 2021-05-24: qty 8.5, 17d supply, fill #0

## 2021-05-24 MED ORDER — FLUTICASONE-SALMETEROL 100-50 MCG/ACT IN AEPB
1.0000 | INHALATION_SPRAY | Freq: Two times a day (BID) | RESPIRATORY_TRACT | 2 refills | Status: AC
  Filled 2021-05-24: qty 180, 90d supply, fill #0
  Filled 2021-06-15: qty 60, 30d supply, fill #0
  Filled 2021-07-15: qty 60, 30d supply, fill #1
  Filled 2021-08-16: qty 60, 30d supply, fill #2
  Filled 2021-09-20: qty 60, 30d supply, fill #3
  Filled 2021-10-20: qty 60, 30d supply, fill #4
  Filled 2021-11-17: qty 60, 30d supply, fill #5

## 2021-06-01 ENCOUNTER — Other Ambulatory Visit (HOSPITAL_COMMUNITY): Payer: Self-pay

## 2021-06-04 DIAGNOSIS — R7303 Prediabetes: Secondary | ICD-10-CM | POA: Diagnosis not present

## 2021-06-04 DIAGNOSIS — I10 Essential (primary) hypertension: Secondary | ICD-10-CM | POA: Diagnosis not present

## 2021-06-04 DIAGNOSIS — E7800 Pure hypercholesterolemia, unspecified: Secondary | ICD-10-CM | POA: Diagnosis not present

## 2021-06-04 DIAGNOSIS — Z23 Encounter for immunization: Secondary | ICD-10-CM | POA: Diagnosis not present

## 2021-06-07 DIAGNOSIS — J3081 Allergic rhinitis due to animal (cat) (dog) hair and dander: Secondary | ICD-10-CM | POA: Diagnosis not present

## 2021-06-07 DIAGNOSIS — J3089 Other allergic rhinitis: Secondary | ICD-10-CM | POA: Diagnosis not present

## 2021-06-07 DIAGNOSIS — J301 Allergic rhinitis due to pollen: Secondary | ICD-10-CM | POA: Diagnosis not present

## 2021-06-09 DIAGNOSIS — J301 Allergic rhinitis due to pollen: Secondary | ICD-10-CM | POA: Diagnosis not present

## 2021-06-16 ENCOUNTER — Other Ambulatory Visit (HOSPITAL_COMMUNITY): Payer: Self-pay

## 2021-06-16 DIAGNOSIS — J3081 Allergic rhinitis due to animal (cat) (dog) hair and dander: Secondary | ICD-10-CM | POA: Diagnosis not present

## 2021-06-16 DIAGNOSIS — J301 Allergic rhinitis due to pollen: Secondary | ICD-10-CM | POA: Diagnosis not present

## 2021-06-16 DIAGNOSIS — J3089 Other allergic rhinitis: Secondary | ICD-10-CM | POA: Diagnosis not present

## 2021-06-18 ENCOUNTER — Other Ambulatory Visit (HOSPITAL_COMMUNITY): Payer: Self-pay

## 2021-06-18 MED ORDER — AZELASTINE HCL 0.1 % NA SOLN
1.0000 | Freq: Two times a day (BID) | NASAL | 6 refills | Status: DC
  Filled 2021-06-18: qty 30, 50d supply, fill #0
  Filled 2021-08-15: qty 30, 50d supply, fill #1
  Filled 2021-09-25: qty 30, 50d supply, fill #2
  Filled 2021-11-17: qty 30, 50d supply, fill #3
  Filled 2022-02-03: qty 30, 50d supply, fill #4
  Filled 2022-04-10: qty 30, 50d supply, fill #5
  Filled 2022-05-27: qty 30, 50d supply, fill #6

## 2021-06-18 MED ORDER — MONTELUKAST SODIUM 10 MG PO TABS
10.0000 mg | ORAL_TABLET | Freq: Every evening | ORAL | 2 refills | Status: DC
  Filled 2021-06-18: qty 90, 90d supply, fill #0
  Filled 2021-09-20: qty 90, 90d supply, fill #1
  Filled 2021-12-23: qty 90, 90d supply, fill #2

## 2021-06-22 ENCOUNTER — Other Ambulatory Visit (HOSPITAL_COMMUNITY): Payer: Self-pay

## 2021-06-24 DIAGNOSIS — J301 Allergic rhinitis due to pollen: Secondary | ICD-10-CM | POA: Diagnosis not present

## 2021-06-24 DIAGNOSIS — J3081 Allergic rhinitis due to animal (cat) (dog) hair and dander: Secondary | ICD-10-CM | POA: Diagnosis not present

## 2021-06-24 DIAGNOSIS — J3089 Other allergic rhinitis: Secondary | ICD-10-CM | POA: Diagnosis not present

## 2021-06-25 ENCOUNTER — Other Ambulatory Visit (HOSPITAL_COMMUNITY): Payer: Self-pay

## 2021-06-30 DIAGNOSIS — J301 Allergic rhinitis due to pollen: Secondary | ICD-10-CM | POA: Diagnosis not present

## 2021-06-30 DIAGNOSIS — J3089 Other allergic rhinitis: Secondary | ICD-10-CM | POA: Diagnosis not present

## 2021-06-30 DIAGNOSIS — J3081 Allergic rhinitis due to animal (cat) (dog) hair and dander: Secondary | ICD-10-CM | POA: Diagnosis not present

## 2021-06-30 DIAGNOSIS — H401131 Primary open-angle glaucoma, bilateral, mild stage: Secondary | ICD-10-CM | POA: Diagnosis not present

## 2021-07-07 DIAGNOSIS — J3081 Allergic rhinitis due to animal (cat) (dog) hair and dander: Secondary | ICD-10-CM | POA: Diagnosis not present

## 2021-07-07 DIAGNOSIS — J301 Allergic rhinitis due to pollen: Secondary | ICD-10-CM | POA: Diagnosis not present

## 2021-07-07 DIAGNOSIS — J3089 Other allergic rhinitis: Secondary | ICD-10-CM | POA: Diagnosis not present

## 2021-07-14 DIAGNOSIS — J3081 Allergic rhinitis due to animal (cat) (dog) hair and dander: Secondary | ICD-10-CM | POA: Diagnosis not present

## 2021-07-14 DIAGNOSIS — J3089 Other allergic rhinitis: Secondary | ICD-10-CM | POA: Diagnosis not present

## 2021-07-14 DIAGNOSIS — J301 Allergic rhinitis due to pollen: Secondary | ICD-10-CM | POA: Diagnosis not present

## 2021-07-15 ENCOUNTER — Other Ambulatory Visit (HOSPITAL_COMMUNITY): Payer: Self-pay

## 2021-07-21 DIAGNOSIS — J3081 Allergic rhinitis due to animal (cat) (dog) hair and dander: Secondary | ICD-10-CM | POA: Diagnosis not present

## 2021-07-21 DIAGNOSIS — J3089 Other allergic rhinitis: Secondary | ICD-10-CM | POA: Diagnosis not present

## 2021-07-21 DIAGNOSIS — J301 Allergic rhinitis due to pollen: Secondary | ICD-10-CM | POA: Diagnosis not present

## 2021-07-28 DIAGNOSIS — J3081 Allergic rhinitis due to animal (cat) (dog) hair and dander: Secondary | ICD-10-CM | POA: Diagnosis not present

## 2021-07-28 DIAGNOSIS — J301 Allergic rhinitis due to pollen: Secondary | ICD-10-CM | POA: Diagnosis not present

## 2021-07-28 DIAGNOSIS — J3089 Other allergic rhinitis: Secondary | ICD-10-CM | POA: Diagnosis not present

## 2021-08-05 DIAGNOSIS — J3081 Allergic rhinitis due to animal (cat) (dog) hair and dander: Secondary | ICD-10-CM | POA: Diagnosis not present

## 2021-08-05 DIAGNOSIS — J3089 Other allergic rhinitis: Secondary | ICD-10-CM | POA: Diagnosis not present

## 2021-08-05 DIAGNOSIS — J301 Allergic rhinitis due to pollen: Secondary | ICD-10-CM | POA: Diagnosis not present

## 2021-08-12 DIAGNOSIS — J3089 Other allergic rhinitis: Secondary | ICD-10-CM | POA: Diagnosis not present

## 2021-08-12 DIAGNOSIS — J3081 Allergic rhinitis due to animal (cat) (dog) hair and dander: Secondary | ICD-10-CM | POA: Diagnosis not present

## 2021-08-12 DIAGNOSIS — J301 Allergic rhinitis due to pollen: Secondary | ICD-10-CM | POA: Diagnosis not present

## 2021-08-15 ENCOUNTER — Other Ambulatory Visit (HOSPITAL_COMMUNITY): Payer: Self-pay

## 2021-08-16 ENCOUNTER — Other Ambulatory Visit (HOSPITAL_COMMUNITY): Payer: Self-pay

## 2021-08-16 MED ORDER — LISINOPRIL-HYDROCHLOROTHIAZIDE 10-12.5 MG PO TABS
1.0000 | ORAL_TABLET | Freq: Every day | ORAL | 3 refills | Status: DC
  Filled 2021-08-16: qty 90, 90d supply, fill #0
  Filled 2021-11-17: qty 90, 90d supply, fill #1
  Filled 2022-02-03: qty 90, 90d supply, fill #2
  Filled 2022-05-20: qty 90, 90d supply, fill #3

## 2021-08-18 ENCOUNTER — Other Ambulatory Visit (HOSPITAL_COMMUNITY): Payer: Self-pay

## 2021-08-18 DIAGNOSIS — J301 Allergic rhinitis due to pollen: Secondary | ICD-10-CM | POA: Diagnosis not present

## 2021-08-18 DIAGNOSIS — J3081 Allergic rhinitis due to animal (cat) (dog) hair and dander: Secondary | ICD-10-CM | POA: Diagnosis not present

## 2021-08-18 DIAGNOSIS — J3089 Other allergic rhinitis: Secondary | ICD-10-CM | POA: Diagnosis not present

## 2021-08-18 MED ORDER — EPINEPHRINE 0.3 MG/0.3ML IJ SOAJ
INTRAMUSCULAR | 1 refills | Status: AC
  Filled 2021-08-18: qty 2, 30d supply, fill #0

## 2021-08-26 DIAGNOSIS — J301 Allergic rhinitis due to pollen: Secondary | ICD-10-CM | POA: Diagnosis not present

## 2021-08-26 DIAGNOSIS — J3081 Allergic rhinitis due to animal (cat) (dog) hair and dander: Secondary | ICD-10-CM | POA: Diagnosis not present

## 2021-08-26 DIAGNOSIS — J3089 Other allergic rhinitis: Secondary | ICD-10-CM | POA: Diagnosis not present

## 2021-09-01 DIAGNOSIS — J3089 Other allergic rhinitis: Secondary | ICD-10-CM | POA: Diagnosis not present

## 2021-09-01 DIAGNOSIS — J301 Allergic rhinitis due to pollen: Secondary | ICD-10-CM | POA: Diagnosis not present

## 2021-09-01 DIAGNOSIS — J3081 Allergic rhinitis due to animal (cat) (dog) hair and dander: Secondary | ICD-10-CM | POA: Diagnosis not present

## 2021-09-15 DIAGNOSIS — J3089 Other allergic rhinitis: Secondary | ICD-10-CM | POA: Diagnosis not present

## 2021-09-15 DIAGNOSIS — J301 Allergic rhinitis due to pollen: Secondary | ICD-10-CM | POA: Diagnosis not present

## 2021-09-15 DIAGNOSIS — J3081 Allergic rhinitis due to animal (cat) (dog) hair and dander: Secondary | ICD-10-CM | POA: Diagnosis not present

## 2021-09-20 ENCOUNTER — Other Ambulatory Visit (HOSPITAL_COMMUNITY): Payer: Self-pay

## 2021-09-21 ENCOUNTER — Other Ambulatory Visit (HOSPITAL_COMMUNITY): Payer: Self-pay

## 2021-09-21 MED ORDER — LEVOCETIRIZINE DIHYDROCHLORIDE 5 MG PO TABS
5.0000 mg | ORAL_TABLET | Freq: Every evening | ORAL | 6 refills | Status: DC
  Filled 2021-09-21: qty 30, 30d supply, fill #0
  Filled 2021-10-20: qty 30, 30d supply, fill #1
  Filled 2021-11-17: qty 30, 30d supply, fill #2
  Filled 2021-12-23: qty 30, 30d supply, fill #3
  Filled 2022-01-23: qty 30, 30d supply, fill #4
  Filled 2022-02-22: qty 30, 30d supply, fill #5
  Filled 2022-03-23: qty 30, 30d supply, fill #6

## 2021-09-23 DIAGNOSIS — J3081 Allergic rhinitis due to animal (cat) (dog) hair and dander: Secondary | ICD-10-CM | POA: Diagnosis not present

## 2021-09-23 DIAGNOSIS — J3089 Other allergic rhinitis: Secondary | ICD-10-CM | POA: Diagnosis not present

## 2021-09-23 DIAGNOSIS — J301 Allergic rhinitis due to pollen: Secondary | ICD-10-CM | POA: Diagnosis not present

## 2021-09-27 ENCOUNTER — Other Ambulatory Visit (HOSPITAL_COMMUNITY): Payer: Self-pay

## 2021-10-04 ENCOUNTER — Other Ambulatory Visit (HOSPITAL_COMMUNITY): Payer: Self-pay

## 2021-10-06 DIAGNOSIS — J3081 Allergic rhinitis due to animal (cat) (dog) hair and dander: Secondary | ICD-10-CM | POA: Diagnosis not present

## 2021-10-06 DIAGNOSIS — J3089 Other allergic rhinitis: Secondary | ICD-10-CM | POA: Diagnosis not present

## 2021-10-06 DIAGNOSIS — J301 Allergic rhinitis due to pollen: Secondary | ICD-10-CM | POA: Diagnosis not present

## 2021-10-14 DIAGNOSIS — J301 Allergic rhinitis due to pollen: Secondary | ICD-10-CM | POA: Diagnosis not present

## 2021-10-14 DIAGNOSIS — J3089 Other allergic rhinitis: Secondary | ICD-10-CM | POA: Diagnosis not present

## 2021-10-14 DIAGNOSIS — J3081 Allergic rhinitis due to animal (cat) (dog) hair and dander: Secondary | ICD-10-CM | POA: Diagnosis not present

## 2021-10-21 ENCOUNTER — Other Ambulatory Visit (HOSPITAL_COMMUNITY): Payer: Self-pay

## 2021-10-21 DIAGNOSIS — J3081 Allergic rhinitis due to animal (cat) (dog) hair and dander: Secondary | ICD-10-CM | POA: Diagnosis not present

## 2021-10-21 DIAGNOSIS — J3089 Other allergic rhinitis: Secondary | ICD-10-CM | POA: Diagnosis not present

## 2021-10-21 DIAGNOSIS — J301 Allergic rhinitis due to pollen: Secondary | ICD-10-CM | POA: Diagnosis not present

## 2021-11-03 DIAGNOSIS — J3089 Other allergic rhinitis: Secondary | ICD-10-CM | POA: Diagnosis not present

## 2021-11-03 DIAGNOSIS — J301 Allergic rhinitis due to pollen: Secondary | ICD-10-CM | POA: Diagnosis not present

## 2021-11-03 DIAGNOSIS — J3081 Allergic rhinitis due to animal (cat) (dog) hair and dander: Secondary | ICD-10-CM | POA: Diagnosis not present

## 2021-11-09 ENCOUNTER — Other Ambulatory Visit: Payer: Self-pay | Admitting: Family Medicine

## 2021-11-09 DIAGNOSIS — Z1231 Encounter for screening mammogram for malignant neoplasm of breast: Secondary | ICD-10-CM

## 2021-11-17 ENCOUNTER — Other Ambulatory Visit (HOSPITAL_COMMUNITY): Payer: Self-pay

## 2021-11-18 DIAGNOSIS — J301 Allergic rhinitis due to pollen: Secondary | ICD-10-CM | POA: Diagnosis not present

## 2021-11-18 DIAGNOSIS — J3081 Allergic rhinitis due to animal (cat) (dog) hair and dander: Secondary | ICD-10-CM | POA: Diagnosis not present

## 2021-11-18 DIAGNOSIS — J3089 Other allergic rhinitis: Secondary | ICD-10-CM | POA: Diagnosis not present

## 2021-11-22 DIAGNOSIS — J301 Allergic rhinitis due to pollen: Secondary | ICD-10-CM | POA: Diagnosis not present

## 2021-11-22 DIAGNOSIS — J3089 Other allergic rhinitis: Secondary | ICD-10-CM | POA: Diagnosis not present

## 2021-11-22 DIAGNOSIS — J3081 Allergic rhinitis due to animal (cat) (dog) hair and dander: Secondary | ICD-10-CM | POA: Diagnosis not present

## 2021-11-25 ENCOUNTER — Other Ambulatory Visit (HOSPITAL_COMMUNITY): Payer: Self-pay

## 2021-12-01 ENCOUNTER — Other Ambulatory Visit (HOSPITAL_COMMUNITY): Payer: Self-pay

## 2021-12-02 DIAGNOSIS — J301 Allergic rhinitis due to pollen: Secondary | ICD-10-CM | POA: Diagnosis not present

## 2021-12-02 DIAGNOSIS — J3081 Allergic rhinitis due to animal (cat) (dog) hair and dander: Secondary | ICD-10-CM | POA: Diagnosis not present

## 2021-12-02 DIAGNOSIS — J3089 Other allergic rhinitis: Secondary | ICD-10-CM | POA: Diagnosis not present

## 2021-12-06 ENCOUNTER — Other Ambulatory Visit (HOSPITAL_COMMUNITY): Payer: Self-pay

## 2021-12-06 MED ORDER — FLUTICASONE-SALMETEROL 100-50 MCG/ACT IN AEPB
1.0000 | INHALATION_SPRAY | Freq: Two times a day (BID) | RESPIRATORY_TRACT | 3 refills | Status: AC
  Filled 2021-12-06: qty 60, 30d supply, fill #0

## 2021-12-10 DIAGNOSIS — J3081 Allergic rhinitis due to animal (cat) (dog) hair and dander: Secondary | ICD-10-CM | POA: Diagnosis not present

## 2021-12-10 DIAGNOSIS — J3089 Other allergic rhinitis: Secondary | ICD-10-CM | POA: Diagnosis not present

## 2021-12-10 DIAGNOSIS — J301 Allergic rhinitis due to pollen: Secondary | ICD-10-CM | POA: Diagnosis not present

## 2021-12-15 ENCOUNTER — Other Ambulatory Visit: Payer: Self-pay

## 2021-12-15 ENCOUNTER — Ambulatory Visit
Admission: RE | Admit: 2021-12-15 | Discharge: 2021-12-15 | Disposition: A | Discharge status: Home / Self Care | Payer: PPO | Source: Ambulatory Visit | Attending: Family Medicine | Admitting: Family Medicine

## 2021-12-15 DIAGNOSIS — Z1231 Encounter for screening mammogram for malignant neoplasm of breast: Secondary | ICD-10-CM | POA: Diagnosis not present

## 2021-12-16 ENCOUNTER — Other Ambulatory Visit: Payer: Self-pay | Admitting: Family Medicine

## 2021-12-16 DIAGNOSIS — E1165 Type 2 diabetes mellitus with hyperglycemia: Secondary | ICD-10-CM | POA: Diagnosis not present

## 2021-12-16 DIAGNOSIS — R928 Other abnormal and inconclusive findings on diagnostic imaging of breast: Secondary | ICD-10-CM

## 2021-12-16 DIAGNOSIS — Z0000 Encounter for general adult medical examination without abnormal findings: Secondary | ICD-10-CM | POA: Diagnosis not present

## 2021-12-16 DIAGNOSIS — E7800 Pure hypercholesterolemia, unspecified: Secondary | ICD-10-CM | POA: Diagnosis not present

## 2021-12-16 DIAGNOSIS — I10 Essential (primary) hypertension: Secondary | ICD-10-CM | POA: Diagnosis not present

## 2021-12-16 DIAGNOSIS — H401131 Primary open-angle glaucoma, bilateral, mild stage: Secondary | ICD-10-CM | POA: Diagnosis not present

## 2021-12-16 DIAGNOSIS — Z1239 Encounter for other screening for malignant neoplasm of breast: Secondary | ICD-10-CM | POA: Diagnosis not present

## 2021-12-20 ENCOUNTER — Other Ambulatory Visit: Payer: Self-pay | Admitting: Family Medicine

## 2021-12-20 DIAGNOSIS — R928 Other abnormal and inconclusive findings on diagnostic imaging of breast: Secondary | ICD-10-CM

## 2021-12-22 ENCOUNTER — Other Ambulatory Visit (HOSPITAL_COMMUNITY): Payer: Self-pay

## 2021-12-22 DIAGNOSIS — J3081 Allergic rhinitis due to animal (cat) (dog) hair and dander: Secondary | ICD-10-CM | POA: Diagnosis not present

## 2021-12-22 DIAGNOSIS — J301 Allergic rhinitis due to pollen: Secondary | ICD-10-CM | POA: Diagnosis not present

## 2021-12-22 DIAGNOSIS — J3089 Other allergic rhinitis: Secondary | ICD-10-CM | POA: Diagnosis not present

## 2021-12-22 MED ORDER — ROSUVASTATIN CALCIUM 10 MG PO TABS
10.0000 mg | ORAL_TABLET | Freq: Every day | ORAL | 0 refills | Status: DC
Start: 2021-12-22 — End: 2023-06-21
  Filled 2021-12-22: qty 45, 45d supply, fill #0

## 2021-12-23 ENCOUNTER — Other Ambulatory Visit (HOSPITAL_COMMUNITY): Payer: Self-pay

## 2021-12-28 ENCOUNTER — Other Ambulatory Visit (HOSPITAL_COMMUNITY): Payer: Self-pay

## 2021-12-28 MED ORDER — FLUTICASONE-SALMETEROL 100-50 MCG/ACT IN AEPB
INHALATION_SPRAY | RESPIRATORY_TRACT | 3 refills | Status: DC
  Filled 2021-12-28: qty 60, 30d supply, fill #0
  Filled 2022-02-03: qty 60, 30d supply, fill #1
  Filled 2022-03-08: qty 60, 30d supply, fill #2
  Filled 2022-04-14: qty 60, 30d supply, fill #3

## 2021-12-29 ENCOUNTER — Other Ambulatory Visit (HOSPITAL_COMMUNITY): Payer: Self-pay

## 2021-12-30 DIAGNOSIS — H401131 Primary open-angle glaucoma, bilateral, mild stage: Secondary | ICD-10-CM | POA: Diagnosis not present

## 2021-12-30 DIAGNOSIS — J301 Allergic rhinitis due to pollen: Secondary | ICD-10-CM | POA: Diagnosis not present

## 2021-12-30 DIAGNOSIS — J3089 Other allergic rhinitis: Secondary | ICD-10-CM | POA: Diagnosis not present

## 2021-12-30 DIAGNOSIS — J3081 Allergic rhinitis due to animal (cat) (dog) hair and dander: Secondary | ICD-10-CM | POA: Diagnosis not present

## 2022-01-03 DIAGNOSIS — J3089 Other allergic rhinitis: Secondary | ICD-10-CM | POA: Diagnosis not present

## 2022-01-03 DIAGNOSIS — J301 Allergic rhinitis due to pollen: Secondary | ICD-10-CM | POA: Diagnosis not present

## 2022-01-03 DIAGNOSIS — J3081 Allergic rhinitis due to animal (cat) (dog) hair and dander: Secondary | ICD-10-CM | POA: Diagnosis not present

## 2022-01-04 ENCOUNTER — Other Ambulatory Visit (HOSPITAL_COMMUNITY): Payer: Self-pay

## 2022-01-10 ENCOUNTER — Ambulatory Visit
Admission: RE | Admit: 2022-01-10 | Discharge: 2022-01-10 | Disposition: A | Discharge status: Home / Self Care | Payer: PPO | Source: Ambulatory Visit | Attending: Family Medicine | Admitting: Family Medicine

## 2022-01-10 ENCOUNTER — Other Ambulatory Visit (HOSPITAL_COMMUNITY): Payer: Self-pay

## 2022-01-10 ENCOUNTER — Other Ambulatory Visit: Payer: Self-pay

## 2022-01-10 ENCOUNTER — Ambulatory Visit: Payer: PPO

## 2022-01-10 DIAGNOSIS — R928 Other abnormal and inconclusive findings on diagnostic imaging of breast: Secondary | ICD-10-CM

## 2022-01-10 DIAGNOSIS — R922 Inconclusive mammogram: Secondary | ICD-10-CM | POA: Diagnosis not present

## 2022-01-11 ENCOUNTER — Other Ambulatory Visit (HOSPITAL_COMMUNITY): Payer: Self-pay

## 2022-01-11 MED ORDER — ACCU-CHEK GUIDE VI STRP
ORAL_STRIP | 2 refills | Status: DC
  Filled 2022-01-11: qty 50, 50d supply, fill #0
  Filled 2022-03-08 – 2022-11-08 (×3): qty 50, 50d supply, fill #1
  Filled 2022-12-24: qty 100, 100d supply, fill #2
  Filled 2022-12-26: qty 50, 50d supply, fill #2

## 2022-01-12 ENCOUNTER — Other Ambulatory Visit (HOSPITAL_COMMUNITY): Payer: Self-pay

## 2022-01-12 DIAGNOSIS — J301 Allergic rhinitis due to pollen: Secondary | ICD-10-CM | POA: Diagnosis not present

## 2022-01-12 DIAGNOSIS — J3089 Other allergic rhinitis: Secondary | ICD-10-CM | POA: Diagnosis not present

## 2022-01-12 DIAGNOSIS — J3081 Allergic rhinitis due to animal (cat) (dog) hair and dander: Secondary | ICD-10-CM | POA: Diagnosis not present

## 2022-01-12 MED ORDER — BLOOD GLUCOSE MONITOR SYSTEM W/DEVICE KIT
PACK | 0 refills | Status: AC
  Filled 2022-01-12: qty 1, 1d supply, fill #0

## 2022-01-13 ENCOUNTER — Other Ambulatory Visit (HOSPITAL_COMMUNITY): Payer: Self-pay

## 2022-01-17 DIAGNOSIS — J301 Allergic rhinitis due to pollen: Secondary | ICD-10-CM | POA: Diagnosis not present

## 2022-01-17 DIAGNOSIS — J3089 Other allergic rhinitis: Secondary | ICD-10-CM | POA: Diagnosis not present

## 2022-01-17 DIAGNOSIS — J3081 Allergic rhinitis due to animal (cat) (dog) hair and dander: Secondary | ICD-10-CM | POA: Diagnosis not present

## 2022-01-24 ENCOUNTER — Other Ambulatory Visit (HOSPITAL_COMMUNITY): Payer: Self-pay

## 2022-01-31 DIAGNOSIS — J3089 Other allergic rhinitis: Secondary | ICD-10-CM | POA: Diagnosis not present

## 2022-01-31 DIAGNOSIS — J301 Allergic rhinitis due to pollen: Secondary | ICD-10-CM | POA: Diagnosis not present

## 2022-01-31 DIAGNOSIS — J3081 Allergic rhinitis due to animal (cat) (dog) hair and dander: Secondary | ICD-10-CM | POA: Diagnosis not present

## 2022-02-03 ENCOUNTER — Other Ambulatory Visit (HOSPITAL_COMMUNITY): Payer: Self-pay

## 2022-02-03 DIAGNOSIS — H40013 Open angle with borderline findings, low risk, bilateral: Secondary | ICD-10-CM | POA: Diagnosis not present

## 2022-02-04 ENCOUNTER — Other Ambulatory Visit (HOSPITAL_COMMUNITY): Payer: Self-pay

## 2022-02-04 DIAGNOSIS — J3089 Other allergic rhinitis: Secondary | ICD-10-CM | POA: Diagnosis not present

## 2022-02-04 DIAGNOSIS — J3081 Allergic rhinitis due to animal (cat) (dog) hair and dander: Secondary | ICD-10-CM | POA: Diagnosis not present

## 2022-02-04 DIAGNOSIS — J301 Allergic rhinitis due to pollen: Secondary | ICD-10-CM | POA: Diagnosis not present

## 2022-02-08 DIAGNOSIS — J301 Allergic rhinitis due to pollen: Secondary | ICD-10-CM | POA: Diagnosis not present

## 2022-02-08 DIAGNOSIS — J3089 Other allergic rhinitis: Secondary | ICD-10-CM | POA: Diagnosis not present

## 2022-02-08 DIAGNOSIS — J3081 Allergic rhinitis due to animal (cat) (dog) hair and dander: Secondary | ICD-10-CM | POA: Diagnosis not present

## 2022-02-21 DIAGNOSIS — J301 Allergic rhinitis due to pollen: Secondary | ICD-10-CM | POA: Diagnosis not present

## 2022-02-21 DIAGNOSIS — J3081 Allergic rhinitis due to animal (cat) (dog) hair and dander: Secondary | ICD-10-CM | POA: Diagnosis not present

## 2022-02-21 DIAGNOSIS — J3089 Other allergic rhinitis: Secondary | ICD-10-CM | POA: Diagnosis not present

## 2022-02-22 ENCOUNTER — Other Ambulatory Visit (HOSPITAL_COMMUNITY): Payer: Self-pay

## 2022-03-04 DIAGNOSIS — J3081 Allergic rhinitis due to animal (cat) (dog) hair and dander: Secondary | ICD-10-CM | POA: Diagnosis not present

## 2022-03-04 DIAGNOSIS — J3089 Other allergic rhinitis: Secondary | ICD-10-CM | POA: Diagnosis not present

## 2022-03-04 DIAGNOSIS — J301 Allergic rhinitis due to pollen: Secondary | ICD-10-CM | POA: Diagnosis not present

## 2022-03-07 IMAGING — MG MM DIGITAL SCREENING BILAT W/ TOMO AND CAD
6 of 10 series · 6 of 30 positions shown · non-contrast
Comparison: Previous exam(s).

CLINICAL DATA: Screening.

EXAM:
DIGITAL SCREENING BILATERAL MAMMOGRAM WITH TOMOSYNTHESIS AND CAD
TECHNIQUE: Bilateral screening digital craniocaudal and mediolateral oblique
mammograms were obtained. Bilateral screening digital breast
tomosynthesis was performed. The images were evaluated with
computer-aided detection.

[R CC synth-2D]
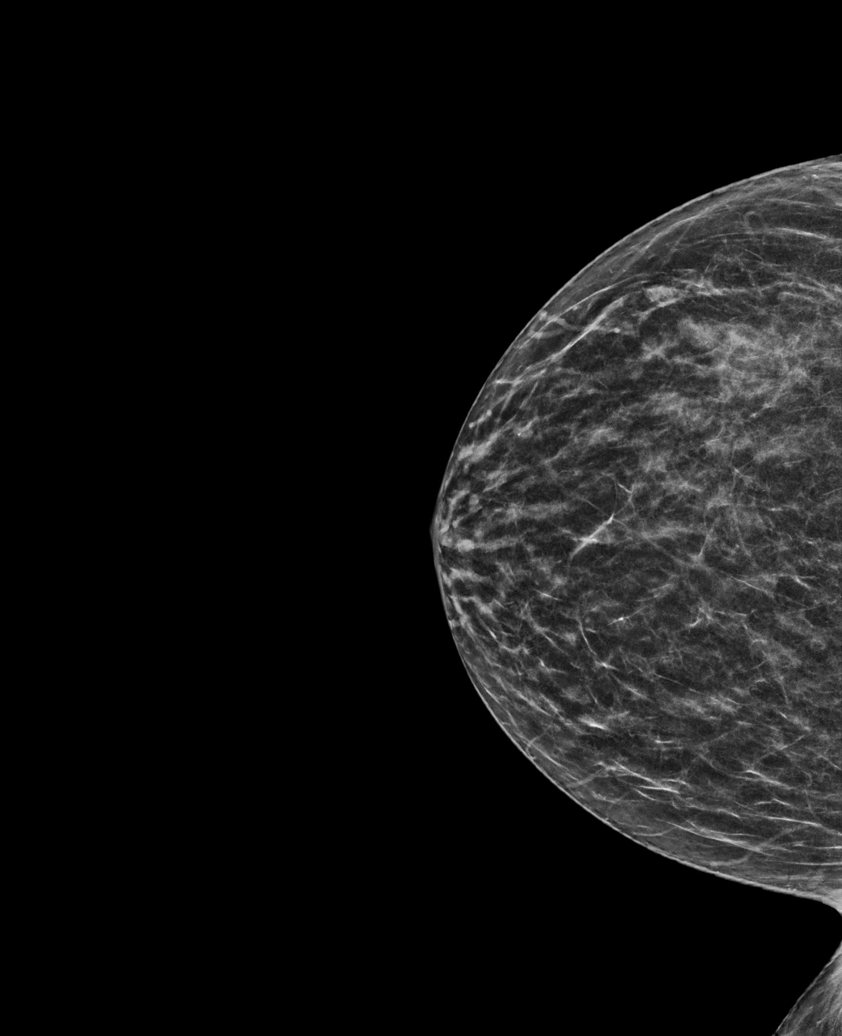

[L CC synth-2D]
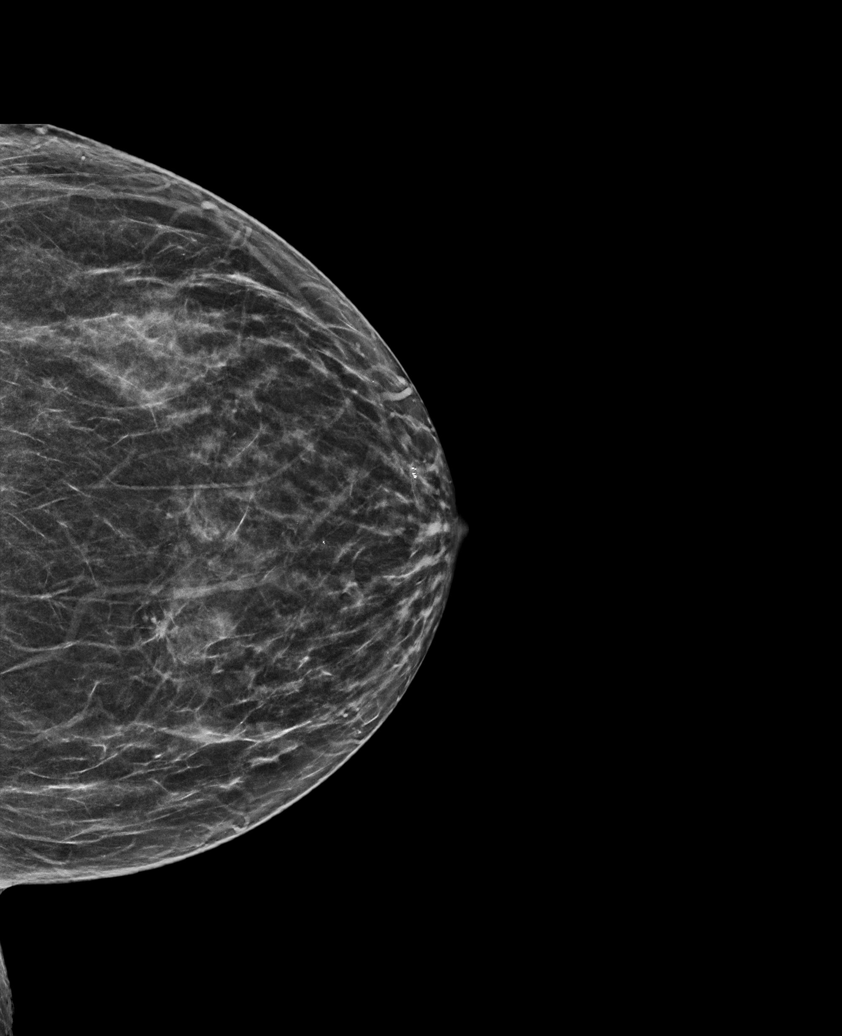

[R MLO synth-2D]
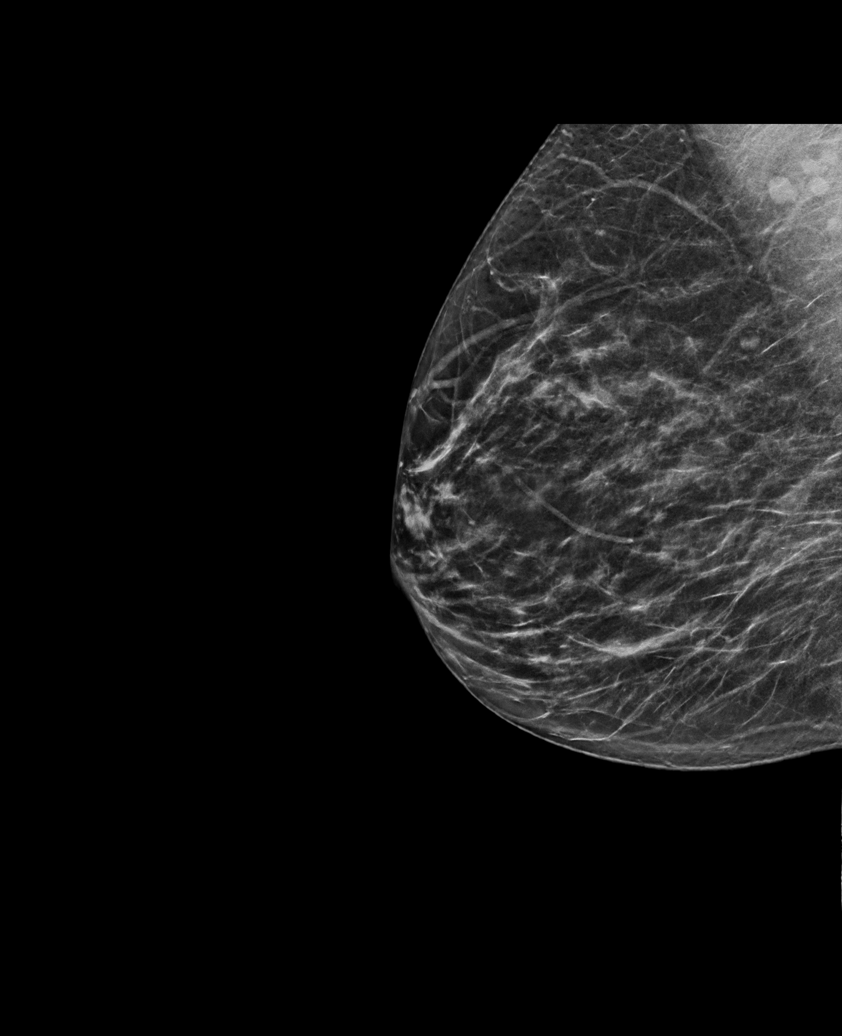

[L MLO synth-2D (1 of 2)]
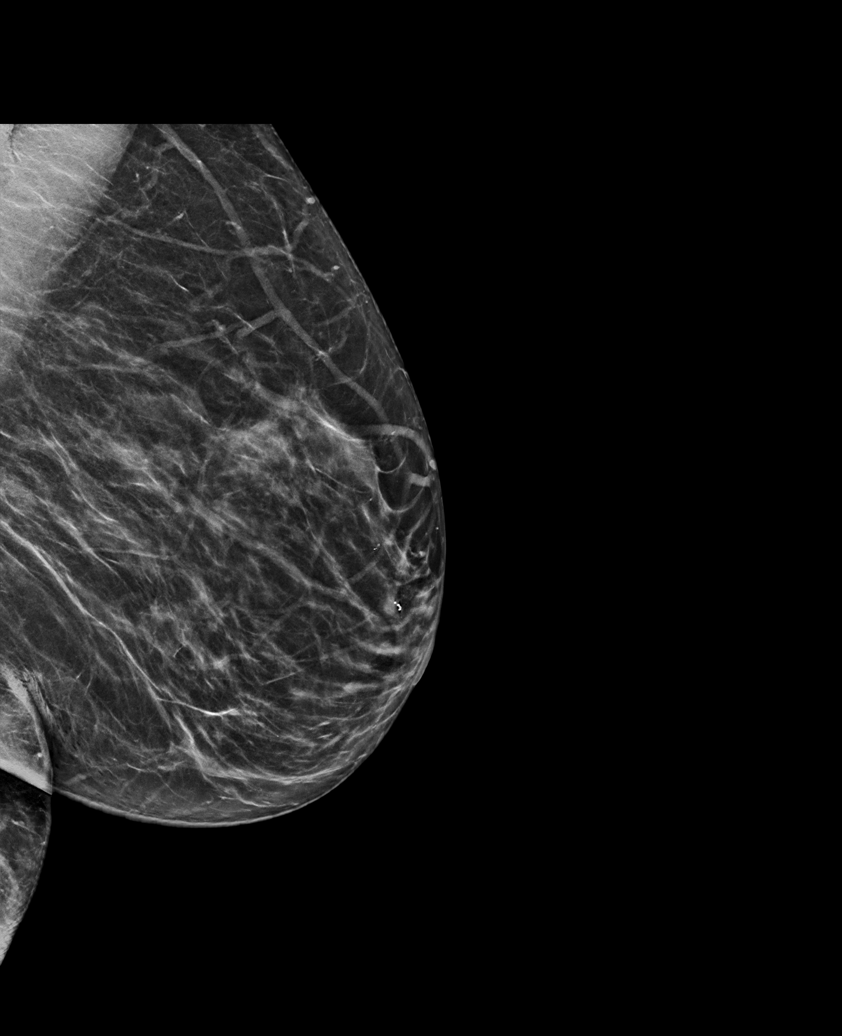

[L MLO synth-2D (2 of 2)]
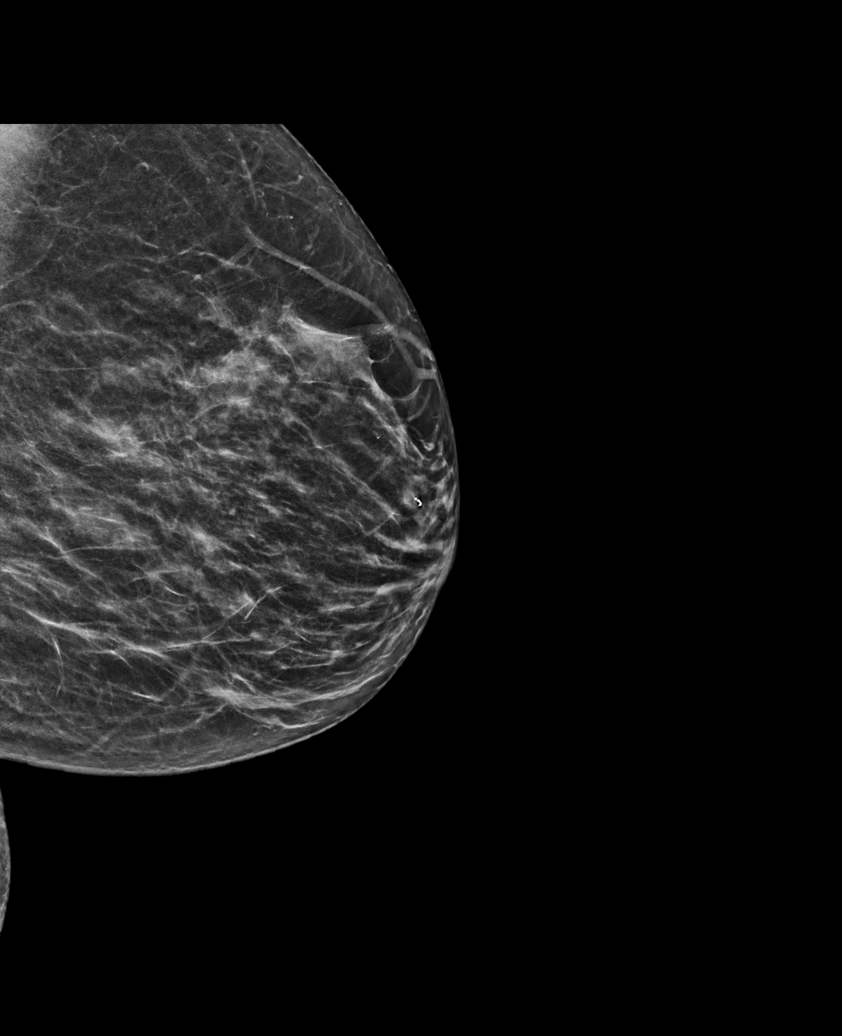

[L CC tomo · tomo slice 29/58.0]
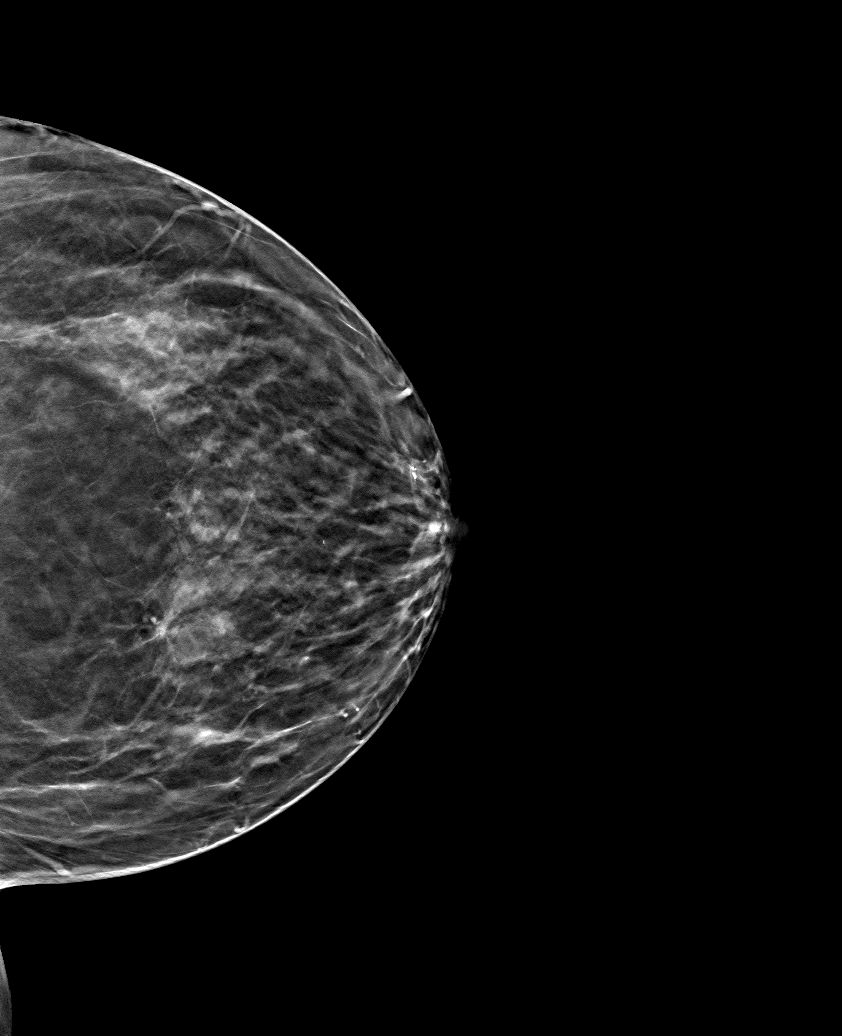

[6 of 30 positions shown; findings below may reference images not displayed]

ACR Breast Density Category b: There are scattered areas of
fibroglandular density.
FINDINGS: In the left breast, a possible asymmetry warrants further
evaluation. In the right breast, no findings suspicious for
malignancy.
IMPRESSION: Further evaluation is suggested for possible asymmetry in the left
breast.

RECOMMENDATION:
Diagnostic mammogram and possibly ultrasound of the left breast.
(Code:[id number])

The patient will be contacted regarding the findings, and additional
imaging will be scheduled.

BI-RADS CATEGORY  0: Incomplete. Need additional imaging evaluation
and/or prior mammograms for comparison.

## 2022-03-08 ENCOUNTER — Other Ambulatory Visit (HOSPITAL_COMMUNITY): Payer: Self-pay

## 2022-03-09 ENCOUNTER — Other Ambulatory Visit (HOSPITAL_COMMUNITY): Payer: Self-pay

## 2022-03-09 DIAGNOSIS — J301 Allergic rhinitis due to pollen: Secondary | ICD-10-CM | POA: Diagnosis not present

## 2022-03-09 DIAGNOSIS — J3081 Allergic rhinitis due to animal (cat) (dog) hair and dander: Secondary | ICD-10-CM | POA: Diagnosis not present

## 2022-03-09 DIAGNOSIS — J3089 Other allergic rhinitis: Secondary | ICD-10-CM | POA: Diagnosis not present

## 2022-03-23 ENCOUNTER — Other Ambulatory Visit (HOSPITAL_COMMUNITY): Payer: Self-pay

## 2022-03-23 MED ORDER — MONTELUKAST SODIUM 10 MG PO TABS
10.0000 mg | ORAL_TABLET | Freq: Every evening | ORAL | 0 refills | Status: DC
  Filled 2022-03-23: qty 90, 90d supply, fill #0

## 2022-03-24 DIAGNOSIS — J3089 Other allergic rhinitis: Secondary | ICD-10-CM | POA: Diagnosis not present

## 2022-03-24 DIAGNOSIS — J301 Allergic rhinitis due to pollen: Secondary | ICD-10-CM | POA: Diagnosis not present

## 2022-03-24 DIAGNOSIS — J3081 Allergic rhinitis due to animal (cat) (dog) hair and dander: Secondary | ICD-10-CM | POA: Diagnosis not present

## 2022-04-08 DIAGNOSIS — J301 Allergic rhinitis due to pollen: Secondary | ICD-10-CM | POA: Diagnosis not present

## 2022-04-08 DIAGNOSIS — J3081 Allergic rhinitis due to animal (cat) (dog) hair and dander: Secondary | ICD-10-CM | POA: Diagnosis not present

## 2022-04-08 DIAGNOSIS — J3089 Other allergic rhinitis: Secondary | ICD-10-CM | POA: Diagnosis not present

## 2022-04-11 ENCOUNTER — Other Ambulatory Visit (HOSPITAL_COMMUNITY): Payer: Self-pay

## 2022-04-11 DIAGNOSIS — J301 Allergic rhinitis due to pollen: Secondary | ICD-10-CM | POA: Diagnosis not present

## 2022-04-11 DIAGNOSIS — J3081 Allergic rhinitis due to animal (cat) (dog) hair and dander: Secondary | ICD-10-CM | POA: Diagnosis not present

## 2022-04-11 DIAGNOSIS — J3089 Other allergic rhinitis: Secondary | ICD-10-CM | POA: Diagnosis not present

## 2022-04-14 ENCOUNTER — Other Ambulatory Visit (HOSPITAL_COMMUNITY): Payer: Self-pay

## 2022-04-21 DIAGNOSIS — J3081 Allergic rhinitis due to animal (cat) (dog) hair and dander: Secondary | ICD-10-CM | POA: Diagnosis not present

## 2022-04-21 DIAGNOSIS — J3089 Other allergic rhinitis: Secondary | ICD-10-CM | POA: Diagnosis not present

## 2022-04-21 DIAGNOSIS — J301 Allergic rhinitis due to pollen: Secondary | ICD-10-CM | POA: Diagnosis not present

## 2022-04-25 ENCOUNTER — Other Ambulatory Visit (HOSPITAL_COMMUNITY): Payer: Self-pay

## 2022-04-25 MED ORDER — LEVOCETIRIZINE DIHYDROCHLORIDE 5 MG PO TABS
5.0000 mg | ORAL_TABLET | Freq: Every evening | ORAL | 5 refills | Status: DC
  Filled 2022-04-25: qty 30, 30d supply, fill #0
  Filled 2022-05-24: qty 30, 30d supply, fill #1
  Filled 2022-06-23: qty 30, 30d supply, fill #2
  Filled 2022-07-24: qty 30, 30d supply, fill #3
  Filled 2022-08-19: qty 30, 30d supply, fill #4
  Filled 2022-09-16: qty 30, 30d supply, fill #5

## 2022-04-28 DIAGNOSIS — J3089 Other allergic rhinitis: Secondary | ICD-10-CM | POA: Diagnosis not present

## 2022-04-28 DIAGNOSIS — J3081 Allergic rhinitis due to animal (cat) (dog) hair and dander: Secondary | ICD-10-CM | POA: Diagnosis not present

## 2022-04-28 DIAGNOSIS — J301 Allergic rhinitis due to pollen: Secondary | ICD-10-CM | POA: Diagnosis not present

## 2022-05-12 DIAGNOSIS — J3089 Other allergic rhinitis: Secondary | ICD-10-CM | POA: Diagnosis not present

## 2022-05-12 DIAGNOSIS — J301 Allergic rhinitis due to pollen: Secondary | ICD-10-CM | POA: Diagnosis not present

## 2022-05-12 DIAGNOSIS — J3081 Allergic rhinitis due to animal (cat) (dog) hair and dander: Secondary | ICD-10-CM | POA: Diagnosis not present

## 2022-05-20 ENCOUNTER — Other Ambulatory Visit (HOSPITAL_COMMUNITY): Payer: Self-pay

## 2022-05-23 ENCOUNTER — Other Ambulatory Visit (HOSPITAL_COMMUNITY): Payer: Self-pay

## 2022-05-23 MED ORDER — FLUTICASONE-SALMETEROL 100-50 MCG/ACT IN AEPB
1.0000 | INHALATION_SPRAY | Freq: Two times a day (BID) | RESPIRATORY_TRACT | 0 refills | Status: DC
  Filled 2022-05-23: qty 60, 30d supply, fill #0

## 2022-05-24 ENCOUNTER — Other Ambulatory Visit (HOSPITAL_COMMUNITY): Payer: Self-pay

## 2022-05-24 DIAGNOSIS — J4530 Mild persistent asthma, uncomplicated: Secondary | ICD-10-CM | POA: Diagnosis not present

## 2022-05-24 DIAGNOSIS — J3089 Other allergic rhinitis: Secondary | ICD-10-CM | POA: Diagnosis not present

## 2022-05-24 DIAGNOSIS — J3081 Allergic rhinitis due to animal (cat) (dog) hair and dander: Secondary | ICD-10-CM | POA: Diagnosis not present

## 2022-05-24 DIAGNOSIS — J301 Allergic rhinitis due to pollen: Secondary | ICD-10-CM | POA: Diagnosis not present

## 2022-05-24 MED ORDER — ALBUTEROL SULFATE HFA 108 (90 BASE) MCG/ACT IN AERS
1.0000 | INHALATION_SPRAY | RESPIRATORY_TRACT | 0 refills | Status: AC
  Filled 2022-05-24: qty 6.7, 17d supply, fill #0

## 2022-05-27 ENCOUNTER — Other Ambulatory Visit (HOSPITAL_COMMUNITY): Payer: Self-pay

## 2022-06-07 DIAGNOSIS — J301 Allergic rhinitis due to pollen: Secondary | ICD-10-CM | POA: Diagnosis not present

## 2022-06-07 DIAGNOSIS — J3081 Allergic rhinitis due to animal (cat) (dog) hair and dander: Secondary | ICD-10-CM | POA: Diagnosis not present

## 2022-06-07 DIAGNOSIS — J3089 Other allergic rhinitis: Secondary | ICD-10-CM | POA: Diagnosis not present

## 2022-06-23 ENCOUNTER — Other Ambulatory Visit (HOSPITAL_COMMUNITY): Payer: Self-pay

## 2022-06-24 ENCOUNTER — Other Ambulatory Visit (HOSPITAL_COMMUNITY): Payer: Self-pay

## 2022-06-24 MED ORDER — MONTELUKAST SODIUM 10 MG PO TABS
10.0000 mg | ORAL_TABLET | Freq: Every evening | ORAL | 2 refills | Status: DC
  Filled 2022-06-24: qty 90, 90d supply, fill #0
  Filled 2022-09-20: qty 90, 90d supply, fill #1
  Filled 2022-12-23 – 2022-12-26 (×2): qty 90, 90d supply, fill #2

## 2022-06-29 DIAGNOSIS — E118 Type 2 diabetes mellitus with unspecified complications: Secondary | ICD-10-CM | POA: Diagnosis not present

## 2022-06-29 DIAGNOSIS — I10 Essential (primary) hypertension: Secondary | ICD-10-CM | POA: Diagnosis not present

## 2022-06-29 DIAGNOSIS — E7800 Pure hypercholesterolemia, unspecified: Secondary | ICD-10-CM | POA: Diagnosis not present

## 2022-06-30 DIAGNOSIS — J301 Allergic rhinitis due to pollen: Secondary | ICD-10-CM | POA: Diagnosis not present

## 2022-06-30 DIAGNOSIS — J3089 Other allergic rhinitis: Secondary | ICD-10-CM | POA: Diagnosis not present

## 2022-06-30 DIAGNOSIS — J3081 Allergic rhinitis due to animal (cat) (dog) hair and dander: Secondary | ICD-10-CM | POA: Diagnosis not present

## 2022-07-01 ENCOUNTER — Other Ambulatory Visit (HOSPITAL_COMMUNITY): Payer: Self-pay

## 2022-07-01 MED ORDER — ROSUVASTATIN CALCIUM 10 MG PO TABS
10.0000 mg | ORAL_TABLET | Freq: Every day | ORAL | 3 refills | Status: DC
  Filled 2022-07-01: qty 90, 90d supply, fill #0
  Filled 2022-09-26: qty 90, 90d supply, fill #1
  Filled 2022-12-23 – 2022-12-26 (×2): qty 90, 90d supply, fill #2
  Filled 2023-03-20: qty 90, 90d supply, fill #3

## 2022-07-06 DIAGNOSIS — J3089 Other allergic rhinitis: Secondary | ICD-10-CM | POA: Diagnosis not present

## 2022-07-06 DIAGNOSIS — J3081 Allergic rhinitis due to animal (cat) (dog) hair and dander: Secondary | ICD-10-CM | POA: Diagnosis not present

## 2022-07-06 DIAGNOSIS — J301 Allergic rhinitis due to pollen: Secondary | ICD-10-CM | POA: Diagnosis not present

## 2022-07-13 ENCOUNTER — Other Ambulatory Visit (HOSPITAL_COMMUNITY): Payer: Self-pay

## 2022-07-13 MED ORDER — AZELASTINE HCL 0.1 % NA SOLN
1.0000 | Freq: Two times a day (BID) | NASAL | 6 refills | Status: DC
  Filled 2022-07-13: qty 30, 50d supply, fill #0
  Filled 2022-09-16: qty 30, 50d supply, fill #1
  Filled 2022-10-31: qty 30, 50d supply, fill #2
  Filled 2022-12-24: qty 30, 30d supply, fill #3
  Filled 2022-12-26: qty 30, 50d supply, fill #3
  Filled 2023-02-14: qty 30, 30d supply, fill #4
  Filled 2023-03-20: qty 30, 30d supply, fill #5
  Filled 2023-04-19: qty 30, 30d supply, fill #6

## 2022-07-13 MED ORDER — FLUTICASONE-SALMETEROL 100-50 MCG/ACT IN AEPB
1.0000 | INHALATION_SPRAY | Freq: Two times a day (BID) | RESPIRATORY_TRACT | 6 refills | Status: DC
  Filled 2022-07-13: qty 60, 30d supply, fill #0
  Filled 2022-08-19: qty 60, 30d supply, fill #1
  Filled 2022-09-26: qty 60, 30d supply, fill #2
  Filled 2022-10-24: qty 60, 30d supply, fill #3
  Filled 2022-12-12: qty 60, 30d supply, fill #4
  Filled 2023-01-22: qty 60, 30d supply, fill #5
  Filled 2023-03-20: qty 60, 30d supply, fill #6

## 2022-07-14 ENCOUNTER — Other Ambulatory Visit (HOSPITAL_COMMUNITY): Payer: Self-pay

## 2022-07-18 DIAGNOSIS — J3089 Other allergic rhinitis: Secondary | ICD-10-CM | POA: Diagnosis not present

## 2022-07-18 DIAGNOSIS — J301 Allergic rhinitis due to pollen: Secondary | ICD-10-CM | POA: Diagnosis not present

## 2022-07-18 DIAGNOSIS — J3081 Allergic rhinitis due to animal (cat) (dog) hair and dander: Secondary | ICD-10-CM | POA: Diagnosis not present

## 2022-07-25 ENCOUNTER — Other Ambulatory Visit (HOSPITAL_COMMUNITY): Payer: Self-pay

## 2022-08-05 DIAGNOSIS — E7800 Pure hypercholesterolemia, unspecified: Secondary | ICD-10-CM | POA: Diagnosis not present

## 2022-08-08 DIAGNOSIS — J3081 Allergic rhinitis due to animal (cat) (dog) hair and dander: Secondary | ICD-10-CM | POA: Diagnosis not present

## 2022-08-08 DIAGNOSIS — J3089 Other allergic rhinitis: Secondary | ICD-10-CM | POA: Diagnosis not present

## 2022-08-08 DIAGNOSIS — J301 Allergic rhinitis due to pollen: Secondary | ICD-10-CM | POA: Diagnosis not present

## 2022-08-19 ENCOUNTER — Other Ambulatory Visit (HOSPITAL_COMMUNITY): Payer: Self-pay

## 2022-08-22 ENCOUNTER — Other Ambulatory Visit (HOSPITAL_COMMUNITY): Payer: Self-pay

## 2022-08-22 DIAGNOSIS — J3081 Allergic rhinitis due to animal (cat) (dog) hair and dander: Secondary | ICD-10-CM | POA: Diagnosis not present

## 2022-08-22 DIAGNOSIS — J3089 Other allergic rhinitis: Secondary | ICD-10-CM | POA: Diagnosis not present

## 2022-08-22 DIAGNOSIS — J301 Allergic rhinitis due to pollen: Secondary | ICD-10-CM | POA: Diagnosis not present

## 2022-08-22 MED ORDER — LISINOPRIL-HYDROCHLOROTHIAZIDE 10-12.5 MG PO TABS
1.0000 | ORAL_TABLET | Freq: Every day | ORAL | 1 refills | Status: DC
  Filled 2022-08-22: qty 90, 90d supply, fill #0
  Filled 2022-11-20: qty 90, 90d supply, fill #1

## 2022-08-23 ENCOUNTER — Other Ambulatory Visit (HOSPITAL_COMMUNITY): Payer: Self-pay

## 2022-09-01 DIAGNOSIS — J301 Allergic rhinitis due to pollen: Secondary | ICD-10-CM | POA: Diagnosis not present

## 2022-09-01 DIAGNOSIS — J3081 Allergic rhinitis due to animal (cat) (dog) hair and dander: Secondary | ICD-10-CM | POA: Diagnosis not present

## 2022-09-01 DIAGNOSIS — J3089 Other allergic rhinitis: Secondary | ICD-10-CM | POA: Diagnosis not present

## 2022-09-05 DIAGNOSIS — J3089 Other allergic rhinitis: Secondary | ICD-10-CM | POA: Diagnosis not present

## 2022-09-05 DIAGNOSIS — J3081 Allergic rhinitis due to animal (cat) (dog) hair and dander: Secondary | ICD-10-CM | POA: Diagnosis not present

## 2022-09-05 DIAGNOSIS — J301 Allergic rhinitis due to pollen: Secondary | ICD-10-CM | POA: Diagnosis not present

## 2022-09-19 ENCOUNTER — Other Ambulatory Visit (HOSPITAL_COMMUNITY): Payer: Self-pay

## 2022-09-19 DIAGNOSIS — J3089 Other allergic rhinitis: Secondary | ICD-10-CM | POA: Diagnosis not present

## 2022-09-19 DIAGNOSIS — J301 Allergic rhinitis due to pollen: Secondary | ICD-10-CM | POA: Diagnosis not present

## 2022-09-19 DIAGNOSIS — J3081 Allergic rhinitis due to animal (cat) (dog) hair and dander: Secondary | ICD-10-CM | POA: Diagnosis not present

## 2022-09-20 ENCOUNTER — Other Ambulatory Visit (HOSPITAL_COMMUNITY): Payer: Self-pay

## 2022-09-26 ENCOUNTER — Other Ambulatory Visit (HOSPITAL_COMMUNITY): Payer: Self-pay

## 2022-09-28 ENCOUNTER — Other Ambulatory Visit (HOSPITAL_COMMUNITY): Payer: Self-pay

## 2022-10-03 DIAGNOSIS — J3089 Other allergic rhinitis: Secondary | ICD-10-CM | POA: Diagnosis not present

## 2022-10-03 DIAGNOSIS — J301 Allergic rhinitis due to pollen: Secondary | ICD-10-CM | POA: Diagnosis not present

## 2022-10-03 DIAGNOSIS — J3081 Allergic rhinitis due to animal (cat) (dog) hair and dander: Secondary | ICD-10-CM | POA: Diagnosis not present

## 2022-10-17 DIAGNOSIS — J301 Allergic rhinitis due to pollen: Secondary | ICD-10-CM | POA: Diagnosis not present

## 2022-10-17 DIAGNOSIS — J3081 Allergic rhinitis due to animal (cat) (dog) hair and dander: Secondary | ICD-10-CM | POA: Diagnosis not present

## 2022-10-17 DIAGNOSIS — J3089 Other allergic rhinitis: Secondary | ICD-10-CM | POA: Diagnosis not present

## 2022-10-20 ENCOUNTER — Other Ambulatory Visit (HOSPITAL_COMMUNITY): Payer: Self-pay

## 2022-10-20 MED ORDER — LEVOCETIRIZINE DIHYDROCHLORIDE 5 MG PO TABS
5.0000 mg | ORAL_TABLET | Freq: Every evening | ORAL | 5 refills | Status: DC
  Filled 2022-10-20: qty 30, 30d supply, fill #0
  Filled 2022-11-20: qty 30, 30d supply, fill #1
  Filled 2022-12-23 – 2022-12-26 (×2): qty 30, 30d supply, fill #2
  Filled 2023-01-22: qty 30, 30d supply, fill #3
  Filled 2023-02-17: qty 30, 30d supply, fill #4
  Filled 2023-03-20: qty 30, 30d supply, fill #5

## 2022-10-24 ENCOUNTER — Other Ambulatory Visit (HOSPITAL_COMMUNITY): Payer: Self-pay

## 2022-10-24 ENCOUNTER — Other Ambulatory Visit: Payer: Self-pay

## 2022-10-24 DIAGNOSIS — J3089 Other allergic rhinitis: Secondary | ICD-10-CM | POA: Diagnosis not present

## 2022-10-24 DIAGNOSIS — J301 Allergic rhinitis due to pollen: Secondary | ICD-10-CM | POA: Diagnosis not present

## 2022-10-24 DIAGNOSIS — J3081 Allergic rhinitis due to animal (cat) (dog) hair and dander: Secondary | ICD-10-CM | POA: Diagnosis not present

## 2022-11-03 ENCOUNTER — Other Ambulatory Visit: Payer: Self-pay | Admitting: Family Medicine

## 2022-11-03 ENCOUNTER — Other Ambulatory Visit (HOSPITAL_COMMUNITY): Payer: Self-pay

## 2022-11-03 DIAGNOSIS — Z1231 Encounter for screening mammogram for malignant neoplasm of breast: Secondary | ICD-10-CM

## 2022-11-08 ENCOUNTER — Other Ambulatory Visit (HOSPITAL_COMMUNITY): Payer: Self-pay

## 2022-11-08 DIAGNOSIS — J3081 Allergic rhinitis due to animal (cat) (dog) hair and dander: Secondary | ICD-10-CM | POA: Diagnosis not present

## 2022-11-08 DIAGNOSIS — J301 Allergic rhinitis due to pollen: Secondary | ICD-10-CM | POA: Diagnosis not present

## 2022-11-08 DIAGNOSIS — J3089 Other allergic rhinitis: Secondary | ICD-10-CM | POA: Diagnosis not present

## 2022-11-18 DIAGNOSIS — J3081 Allergic rhinitis due to animal (cat) (dog) hair and dander: Secondary | ICD-10-CM | POA: Diagnosis not present

## 2022-11-18 DIAGNOSIS — J301 Allergic rhinitis due to pollen: Secondary | ICD-10-CM | POA: Diagnosis not present

## 2022-11-18 DIAGNOSIS — J3089 Other allergic rhinitis: Secondary | ICD-10-CM | POA: Diagnosis not present

## 2022-11-23 ENCOUNTER — Other Ambulatory Visit (HOSPITAL_COMMUNITY): Payer: Self-pay

## 2022-11-24 DIAGNOSIS — J301 Allergic rhinitis due to pollen: Secondary | ICD-10-CM | POA: Diagnosis not present

## 2022-11-24 DIAGNOSIS — J3081 Allergic rhinitis due to animal (cat) (dog) hair and dander: Secondary | ICD-10-CM | POA: Diagnosis not present

## 2022-11-24 DIAGNOSIS — J3089 Other allergic rhinitis: Secondary | ICD-10-CM | POA: Diagnosis not present

## 2022-12-01 DIAGNOSIS — J3089 Other allergic rhinitis: Secondary | ICD-10-CM | POA: Diagnosis not present

## 2022-12-01 DIAGNOSIS — J301 Allergic rhinitis due to pollen: Secondary | ICD-10-CM | POA: Diagnosis not present

## 2022-12-01 DIAGNOSIS — J3081 Allergic rhinitis due to animal (cat) (dog) hair and dander: Secondary | ICD-10-CM | POA: Diagnosis not present

## 2022-12-12 ENCOUNTER — Other Ambulatory Visit: Payer: Self-pay

## 2022-12-22 DIAGNOSIS — J301 Allergic rhinitis due to pollen: Secondary | ICD-10-CM | POA: Diagnosis not present

## 2022-12-22 DIAGNOSIS — J3089 Other allergic rhinitis: Secondary | ICD-10-CM | POA: Diagnosis not present

## 2022-12-22 DIAGNOSIS — J3081 Allergic rhinitis due to animal (cat) (dog) hair and dander: Secondary | ICD-10-CM | POA: Diagnosis not present

## 2022-12-26 ENCOUNTER — Other Ambulatory Visit: Payer: Self-pay

## 2022-12-26 ENCOUNTER — Other Ambulatory Visit (HOSPITAL_COMMUNITY): Payer: Self-pay

## 2022-12-29 DIAGNOSIS — J301 Allergic rhinitis due to pollen: Secondary | ICD-10-CM | POA: Diagnosis not present

## 2022-12-29 DIAGNOSIS — J3081 Allergic rhinitis due to animal (cat) (dog) hair and dander: Secondary | ICD-10-CM | POA: Diagnosis not present

## 2022-12-29 DIAGNOSIS — J3089 Other allergic rhinitis: Secondary | ICD-10-CM | POA: Diagnosis not present

## 2023-01-04 DIAGNOSIS — J301 Allergic rhinitis due to pollen: Secondary | ICD-10-CM | POA: Diagnosis not present

## 2023-01-04 DIAGNOSIS — J3081 Allergic rhinitis due to animal (cat) (dog) hair and dander: Secondary | ICD-10-CM | POA: Diagnosis not present

## 2023-01-04 DIAGNOSIS — J3089 Other allergic rhinitis: Secondary | ICD-10-CM | POA: Diagnosis not present

## 2023-01-11 ENCOUNTER — Ambulatory Visit
Admission: RE | Admit: 2023-01-11 | Discharge: 2023-01-11 | Disposition: A | Discharge status: Home / Self Care | Payer: Medicare Other | Source: Ambulatory Visit

## 2023-01-11 DIAGNOSIS — Z1231 Encounter for screening mammogram for malignant neoplasm of breast: Secondary | ICD-10-CM | POA: Diagnosis not present

## 2023-01-12 DIAGNOSIS — I10 Essential (primary) hypertension: Secondary | ICD-10-CM | POA: Diagnosis not present

## 2023-01-12 DIAGNOSIS — Z9181 History of falling: Secondary | ICD-10-CM | POA: Diagnosis not present

## 2023-01-12 DIAGNOSIS — Z23 Encounter for immunization: Secondary | ICD-10-CM | POA: Diagnosis not present

## 2023-01-12 DIAGNOSIS — H401131 Primary open-angle glaucoma, bilateral, mild stage: Secondary | ICD-10-CM | POA: Diagnosis not present

## 2023-01-12 DIAGNOSIS — E7800 Pure hypercholesterolemia, unspecified: Secondary | ICD-10-CM | POA: Diagnosis not present

## 2023-01-12 DIAGNOSIS — Z0000 Encounter for general adult medical examination without abnormal findings: Secondary | ICD-10-CM | POA: Diagnosis not present

## 2023-01-12 DIAGNOSIS — E1165 Type 2 diabetes mellitus with hyperglycemia: Secondary | ICD-10-CM | POA: Diagnosis not present

## 2023-01-12 DIAGNOSIS — J45909 Unspecified asthma, uncomplicated: Secondary | ICD-10-CM | POA: Diagnosis not present

## 2023-01-12 DIAGNOSIS — E118 Type 2 diabetes mellitus with unspecified complications: Secondary | ICD-10-CM | POA: Diagnosis not present

## 2023-01-20 DIAGNOSIS — J3081 Allergic rhinitis due to animal (cat) (dog) hair and dander: Secondary | ICD-10-CM | POA: Diagnosis not present

## 2023-01-20 DIAGNOSIS — J3089 Other allergic rhinitis: Secondary | ICD-10-CM | POA: Diagnosis not present

## 2023-01-20 DIAGNOSIS — J301 Allergic rhinitis due to pollen: Secondary | ICD-10-CM | POA: Diagnosis not present

## 2023-01-25 DIAGNOSIS — H40213 Acute angle-closure glaucoma, bilateral: Secondary | ICD-10-CM | POA: Diagnosis not present

## 2023-01-26 DIAGNOSIS — J3089 Other allergic rhinitis: Secondary | ICD-10-CM | POA: Diagnosis not present

## 2023-01-26 DIAGNOSIS — J301 Allergic rhinitis due to pollen: Secondary | ICD-10-CM | POA: Diagnosis not present

## 2023-01-26 DIAGNOSIS — J3081 Allergic rhinitis due to animal (cat) (dog) hair and dander: Secondary | ICD-10-CM | POA: Diagnosis not present

## 2023-02-02 DIAGNOSIS — J3089 Other allergic rhinitis: Secondary | ICD-10-CM | POA: Diagnosis not present

## 2023-02-02 DIAGNOSIS — J301 Allergic rhinitis due to pollen: Secondary | ICD-10-CM | POA: Diagnosis not present

## 2023-02-02 DIAGNOSIS — J3081 Allergic rhinitis due to animal (cat) (dog) hair and dander: Secondary | ICD-10-CM | POA: Diagnosis not present

## 2023-02-10 ENCOUNTER — Other Ambulatory Visit (HOSPITAL_COMMUNITY): Payer: Self-pay

## 2023-02-10 DIAGNOSIS — J3081 Allergic rhinitis due to animal (cat) (dog) hair and dander: Secondary | ICD-10-CM | POA: Diagnosis not present

## 2023-02-10 DIAGNOSIS — J301 Allergic rhinitis due to pollen: Secondary | ICD-10-CM | POA: Diagnosis not present

## 2023-02-10 DIAGNOSIS — J3089 Other allergic rhinitis: Secondary | ICD-10-CM | POA: Diagnosis not present

## 2023-02-10 MED ORDER — EPINEPHRINE 0.3 MG/0.3ML IJ SOAJ
INTRAMUSCULAR | 1 refills | Status: AC
  Filled 2023-02-10: qty 2, 30d supply, fill #0

## 2023-02-14 ENCOUNTER — Other Ambulatory Visit (HOSPITAL_COMMUNITY): Payer: Self-pay

## 2023-02-14 MED ORDER — ACCU-CHEK GUIDE VI STRP
ORAL_STRIP | 2 refills | Status: AC
  Filled 2023-02-14: qty 100, 50d supply, fill #0
  Filled 2023-04-03: qty 100, 50d supply, fill #1
  Filled 2023-06-19: qty 100, 50d supply, fill #2

## 2023-02-16 DIAGNOSIS — J3089 Other allergic rhinitis: Secondary | ICD-10-CM | POA: Diagnosis not present

## 2023-02-16 DIAGNOSIS — J3081 Allergic rhinitis due to animal (cat) (dog) hair and dander: Secondary | ICD-10-CM | POA: Diagnosis not present

## 2023-02-16 DIAGNOSIS — J301 Allergic rhinitis due to pollen: Secondary | ICD-10-CM | POA: Diagnosis not present

## 2023-02-17 ENCOUNTER — Other Ambulatory Visit: Payer: Self-pay

## 2023-02-17 ENCOUNTER — Other Ambulatory Visit (HOSPITAL_COMMUNITY): Payer: Self-pay

## 2023-02-17 MED ORDER — LISINOPRIL-HYDROCHLOROTHIAZIDE 10-12.5 MG PO TABS
1.0000 | ORAL_TABLET | Freq: Every day | ORAL | 1 refills | Status: DC
  Filled 2023-02-17: qty 90, 90d supply, fill #0
  Filled 2023-05-19: qty 90, 90d supply, fill #1

## 2023-02-23 DIAGNOSIS — J3089 Other allergic rhinitis: Secondary | ICD-10-CM | POA: Diagnosis not present

## 2023-02-23 DIAGNOSIS — J301 Allergic rhinitis due to pollen: Secondary | ICD-10-CM | POA: Diagnosis not present

## 2023-02-23 DIAGNOSIS — J3081 Allergic rhinitis due to animal (cat) (dog) hair and dander: Secondary | ICD-10-CM | POA: Diagnosis not present

## 2023-03-03 DIAGNOSIS — J3081 Allergic rhinitis due to animal (cat) (dog) hair and dander: Secondary | ICD-10-CM | POA: Diagnosis not present

## 2023-03-03 DIAGNOSIS — J301 Allergic rhinitis due to pollen: Secondary | ICD-10-CM | POA: Diagnosis not present

## 2023-03-03 DIAGNOSIS — J3089 Other allergic rhinitis: Secondary | ICD-10-CM | POA: Diagnosis not present

## 2023-03-17 DIAGNOSIS — J3081 Allergic rhinitis due to animal (cat) (dog) hair and dander: Secondary | ICD-10-CM | POA: Diagnosis not present

## 2023-03-17 DIAGNOSIS — J3089 Other allergic rhinitis: Secondary | ICD-10-CM | POA: Diagnosis not present

## 2023-03-17 DIAGNOSIS — J301 Allergic rhinitis due to pollen: Secondary | ICD-10-CM | POA: Diagnosis not present

## 2023-03-20 ENCOUNTER — Other Ambulatory Visit (HOSPITAL_COMMUNITY): Payer: Self-pay

## 2023-03-20 MED ORDER — MONTELUKAST SODIUM 10 MG PO TABS
10.0000 mg | ORAL_TABLET | Freq: Every evening | ORAL | 1 refills | Status: DC
  Filled 2023-03-20: qty 90, 90d supply, fill #0
  Filled 2023-06-20: qty 90, 90d supply, fill #1

## 2023-03-22 DIAGNOSIS — J301 Allergic rhinitis due to pollen: Secondary | ICD-10-CM | POA: Diagnosis not present

## 2023-03-22 DIAGNOSIS — J3089 Other allergic rhinitis: Secondary | ICD-10-CM | POA: Diagnosis not present

## 2023-03-22 DIAGNOSIS — J3081 Allergic rhinitis due to animal (cat) (dog) hair and dander: Secondary | ICD-10-CM | POA: Diagnosis not present

## 2023-03-30 DIAGNOSIS — J3089 Other allergic rhinitis: Secondary | ICD-10-CM | POA: Diagnosis not present

## 2023-03-30 DIAGNOSIS — J301 Allergic rhinitis due to pollen: Secondary | ICD-10-CM | POA: Diagnosis not present

## 2023-03-30 DIAGNOSIS — J3081 Allergic rhinitis due to animal (cat) (dog) hair and dander: Secondary | ICD-10-CM | POA: Diagnosis not present

## 2023-04-12 DIAGNOSIS — J301 Allergic rhinitis due to pollen: Secondary | ICD-10-CM | POA: Diagnosis not present

## 2023-04-12 DIAGNOSIS — J3089 Other allergic rhinitis: Secondary | ICD-10-CM | POA: Diagnosis not present

## 2023-04-12 DIAGNOSIS — J3081 Allergic rhinitis due to animal (cat) (dog) hair and dander: Secondary | ICD-10-CM | POA: Diagnosis not present

## 2023-04-19 ENCOUNTER — Other Ambulatory Visit (HOSPITAL_COMMUNITY): Payer: Self-pay

## 2023-04-19 DIAGNOSIS — J301 Allergic rhinitis due to pollen: Secondary | ICD-10-CM | POA: Diagnosis not present

## 2023-04-19 DIAGNOSIS — J3081 Allergic rhinitis due to animal (cat) (dog) hair and dander: Secondary | ICD-10-CM | POA: Diagnosis not present

## 2023-04-19 DIAGNOSIS — H401131 Primary open-angle glaucoma, bilateral, mild stage: Secondary | ICD-10-CM | POA: Diagnosis not present

## 2023-04-19 DIAGNOSIS — H2513 Age-related nuclear cataract, bilateral: Secondary | ICD-10-CM | POA: Diagnosis not present

## 2023-04-19 DIAGNOSIS — J3089 Other allergic rhinitis: Secondary | ICD-10-CM | POA: Diagnosis not present

## 2023-04-19 DIAGNOSIS — E119 Type 2 diabetes mellitus without complications: Secondary | ICD-10-CM | POA: Diagnosis not present

## 2023-04-20 ENCOUNTER — Other Ambulatory Visit (HOSPITAL_COMMUNITY): Payer: Self-pay

## 2023-04-20 MED ORDER — LEVOCETIRIZINE DIHYDROCHLORIDE 5 MG PO TABS
5.0000 mg | ORAL_TABLET | Freq: Every evening | ORAL | 3 refills | Status: DC
  Filled 2023-04-20: qty 30, 30d supply, fill #0
  Filled 2023-05-19: qty 30, 30d supply, fill #1
  Filled 2023-06-19: qty 30, 30d supply, fill #2
  Filled 2023-07-28: qty 30, 30d supply, fill #3

## 2023-04-27 DIAGNOSIS — J301 Allergic rhinitis due to pollen: Secondary | ICD-10-CM | POA: Diagnosis not present

## 2023-04-27 DIAGNOSIS — J3081 Allergic rhinitis due to animal (cat) (dog) hair and dander: Secondary | ICD-10-CM | POA: Diagnosis not present

## 2023-04-27 DIAGNOSIS — J3089 Other allergic rhinitis: Secondary | ICD-10-CM | POA: Diagnosis not present

## 2023-05-04 DIAGNOSIS — J301 Allergic rhinitis due to pollen: Secondary | ICD-10-CM | POA: Diagnosis not present

## 2023-05-04 DIAGNOSIS — J3089 Other allergic rhinitis: Secondary | ICD-10-CM | POA: Diagnosis not present

## 2023-05-04 DIAGNOSIS — J3081 Allergic rhinitis due to animal (cat) (dog) hair and dander: Secondary | ICD-10-CM | POA: Diagnosis not present

## 2023-05-18 DIAGNOSIS — J3089 Other allergic rhinitis: Secondary | ICD-10-CM | POA: Diagnosis not present

## 2023-05-18 DIAGNOSIS — J301 Allergic rhinitis due to pollen: Secondary | ICD-10-CM | POA: Diagnosis not present

## 2023-05-18 DIAGNOSIS — J3081 Allergic rhinitis due to animal (cat) (dog) hair and dander: Secondary | ICD-10-CM | POA: Diagnosis not present

## 2023-05-19 ENCOUNTER — Other Ambulatory Visit (HOSPITAL_COMMUNITY): Payer: Self-pay

## 2023-05-19 ENCOUNTER — Other Ambulatory Visit: Payer: Self-pay

## 2023-05-19 MED ORDER — FLUTICASONE-SALMETEROL 100-50 MCG/ACT IN AEPB
1.0000 | INHALATION_SPRAY | Freq: Two times a day (BID) | RESPIRATORY_TRACT | 1 refills | Status: DC
  Filled 2023-05-19: qty 60, 30d supply, fill #0
  Filled 2023-07-24: qty 60, 30d supply, fill #1

## 2023-05-24 ENCOUNTER — Other Ambulatory Visit (HOSPITAL_COMMUNITY): Payer: Self-pay

## 2023-05-25 DIAGNOSIS — J301 Allergic rhinitis due to pollen: Secondary | ICD-10-CM | POA: Diagnosis not present

## 2023-05-25 DIAGNOSIS — J3089 Other allergic rhinitis: Secondary | ICD-10-CM | POA: Diagnosis not present

## 2023-05-25 DIAGNOSIS — J3081 Allergic rhinitis due to animal (cat) (dog) hair and dander: Secondary | ICD-10-CM | POA: Diagnosis not present

## 2023-06-16 DIAGNOSIS — J301 Allergic rhinitis due to pollen: Secondary | ICD-10-CM | POA: Diagnosis not present

## 2023-06-16 DIAGNOSIS — J4530 Mild persistent asthma, uncomplicated: Secondary | ICD-10-CM | POA: Diagnosis not present

## 2023-06-16 DIAGNOSIS — J3089 Other allergic rhinitis: Secondary | ICD-10-CM | POA: Diagnosis not present

## 2023-06-16 DIAGNOSIS — J3081 Allergic rhinitis due to animal (cat) (dog) hair and dander: Secondary | ICD-10-CM | POA: Diagnosis not present

## 2023-06-19 ENCOUNTER — Other Ambulatory Visit (HOSPITAL_COMMUNITY): Payer: Self-pay

## 2023-06-20 ENCOUNTER — Other Ambulatory Visit: Payer: Self-pay

## 2023-06-20 ENCOUNTER — Other Ambulatory Visit (HOSPITAL_COMMUNITY): Payer: Self-pay

## 2023-06-20 MED ORDER — ROSUVASTATIN CALCIUM 10 MG PO TABS
10.0000 mg | ORAL_TABLET | Freq: Every day | ORAL | 3 refills | Status: AC
  Filled 2023-06-20: qty 90, 90d supply, fill #0
  Filled 2023-09-17: qty 90, 90d supply, fill #1

## 2023-06-21 ENCOUNTER — Other Ambulatory Visit (HOSPITAL_COMMUNITY): Payer: Self-pay

## 2023-06-21 DIAGNOSIS — J3089 Other allergic rhinitis: Secondary | ICD-10-CM | POA: Diagnosis not present

## 2023-06-21 DIAGNOSIS — J3081 Allergic rhinitis due to animal (cat) (dog) hair and dander: Secondary | ICD-10-CM | POA: Diagnosis not present

## 2023-06-21 DIAGNOSIS — J301 Allergic rhinitis due to pollen: Secondary | ICD-10-CM | POA: Diagnosis not present

## 2023-06-21 MED ORDER — ROSUVASTATIN CALCIUM 10 MG PO TABS
10.0000 mg | ORAL_TABLET | Freq: Every day | ORAL | 3 refills | Status: DC
  Filled 2023-12-23: qty 90, 90d supply, fill #0
  Filled 2024-03-23: qty 90, 90d supply, fill #1

## 2023-06-28 DIAGNOSIS — J3089 Other allergic rhinitis: Secondary | ICD-10-CM | POA: Diagnosis not present

## 2023-06-28 DIAGNOSIS — J301 Allergic rhinitis due to pollen: Secondary | ICD-10-CM | POA: Diagnosis not present

## 2023-06-28 DIAGNOSIS — J3081 Allergic rhinitis due to animal (cat) (dog) hair and dander: Secondary | ICD-10-CM | POA: Diagnosis not present

## 2023-07-05 DIAGNOSIS — J3089 Other allergic rhinitis: Secondary | ICD-10-CM | POA: Diagnosis not present

## 2023-07-05 DIAGNOSIS — J301 Allergic rhinitis due to pollen: Secondary | ICD-10-CM | POA: Diagnosis not present

## 2023-07-05 DIAGNOSIS — J3081 Allergic rhinitis due to animal (cat) (dog) hair and dander: Secondary | ICD-10-CM | POA: Diagnosis not present

## 2023-07-13 DIAGNOSIS — J301 Allergic rhinitis due to pollen: Secondary | ICD-10-CM | POA: Diagnosis not present

## 2023-07-13 DIAGNOSIS — J3089 Other allergic rhinitis: Secondary | ICD-10-CM | POA: Diagnosis not present

## 2023-07-13 DIAGNOSIS — I10 Essential (primary) hypertension: Secondary | ICD-10-CM | POA: Diagnosis not present

## 2023-07-13 DIAGNOSIS — E1165 Type 2 diabetes mellitus with hyperglycemia: Secondary | ICD-10-CM | POA: Diagnosis not present

## 2023-07-13 DIAGNOSIS — J3081 Allergic rhinitis due to animal (cat) (dog) hair and dander: Secondary | ICD-10-CM | POA: Diagnosis not present

## 2023-07-13 DIAGNOSIS — J45909 Unspecified asthma, uncomplicated: Secondary | ICD-10-CM | POA: Diagnosis not present

## 2023-07-13 DIAGNOSIS — E7800 Pure hypercholesterolemia, unspecified: Secondary | ICD-10-CM | POA: Diagnosis not present

## 2023-07-21 DIAGNOSIS — J3081 Allergic rhinitis due to animal (cat) (dog) hair and dander: Secondary | ICD-10-CM | POA: Diagnosis not present

## 2023-07-21 DIAGNOSIS — J301 Allergic rhinitis due to pollen: Secondary | ICD-10-CM | POA: Diagnosis not present

## 2023-07-21 DIAGNOSIS — J3089 Other allergic rhinitis: Secondary | ICD-10-CM | POA: Diagnosis not present

## 2023-07-24 ENCOUNTER — Other Ambulatory Visit: Payer: Self-pay

## 2023-07-28 ENCOUNTER — Other Ambulatory Visit (HOSPITAL_COMMUNITY): Payer: Self-pay

## 2023-07-28 DIAGNOSIS — J3081 Allergic rhinitis due to animal (cat) (dog) hair and dander: Secondary | ICD-10-CM | POA: Diagnosis not present

## 2023-07-28 DIAGNOSIS — J301 Allergic rhinitis due to pollen: Secondary | ICD-10-CM | POA: Diagnosis not present

## 2023-07-28 DIAGNOSIS — J3089 Other allergic rhinitis: Secondary | ICD-10-CM | POA: Diagnosis not present

## 2023-07-29 ENCOUNTER — Other Ambulatory Visit (HOSPITAL_COMMUNITY): Payer: Self-pay

## 2023-08-01 ENCOUNTER — Other Ambulatory Visit: Payer: Self-pay

## 2023-08-01 ENCOUNTER — Other Ambulatory Visit (HOSPITAL_COMMUNITY): Payer: Self-pay

## 2023-08-01 MED ORDER — AZELASTINE HCL 137 MCG/SPRAY NA SOLN
1.0000 | Freq: Two times a day (BID) | NASAL | 6 refills | Status: DC
  Filled 2023-08-01: qty 30, 30d supply, fill #0
  Filled 2023-09-09: qty 30, 30d supply, fill #1
  Filled 2023-10-08: qty 30, 30d supply, fill #2
  Filled 2024-01-20: qty 30, 30d supply, fill #3
  Filled 2024-02-26: qty 30, 30d supply, fill #4
  Filled 2024-04-28: qty 30, 30d supply, fill #5
  Filled 2024-06-09: qty 30, 30d supply, fill #6

## 2023-08-02 ENCOUNTER — Other Ambulatory Visit: Payer: Self-pay

## 2023-08-03 DIAGNOSIS — J301 Allergic rhinitis due to pollen: Secondary | ICD-10-CM | POA: Diagnosis not present

## 2023-08-03 DIAGNOSIS — J3089 Other allergic rhinitis: Secondary | ICD-10-CM | POA: Diagnosis not present

## 2023-08-03 DIAGNOSIS — J3081 Allergic rhinitis due to animal (cat) (dog) hair and dander: Secondary | ICD-10-CM | POA: Diagnosis not present

## 2023-08-10 DIAGNOSIS — J3081 Allergic rhinitis due to animal (cat) (dog) hair and dander: Secondary | ICD-10-CM | POA: Diagnosis not present

## 2023-08-10 DIAGNOSIS — J3089 Other allergic rhinitis: Secondary | ICD-10-CM | POA: Diagnosis not present

## 2023-08-10 DIAGNOSIS — J301 Allergic rhinitis due to pollen: Secondary | ICD-10-CM | POA: Diagnosis not present

## 2023-08-15 ENCOUNTER — Other Ambulatory Visit: Payer: Self-pay

## 2023-08-15 ENCOUNTER — Other Ambulatory Visit (HOSPITAL_COMMUNITY): Payer: Self-pay

## 2023-08-15 MED ORDER — LISINOPRIL-HYDROCHLOROTHIAZIDE 10-12.5 MG PO TABS
1.0000 | ORAL_TABLET | Freq: Every day | ORAL | 1 refills | Status: DC
  Filled 2023-08-15: qty 90, 90d supply, fill #0
  Filled 2023-11-15: qty 90, 90d supply, fill #1

## 2023-08-17 DIAGNOSIS — J301 Allergic rhinitis due to pollen: Secondary | ICD-10-CM | POA: Diagnosis not present

## 2023-08-17 DIAGNOSIS — J3089 Other allergic rhinitis: Secondary | ICD-10-CM | POA: Diagnosis not present

## 2023-08-17 DIAGNOSIS — J3081 Allergic rhinitis due to animal (cat) (dog) hair and dander: Secondary | ICD-10-CM | POA: Diagnosis not present

## 2023-08-28 ENCOUNTER — Other Ambulatory Visit: Payer: Self-pay

## 2023-08-28 ENCOUNTER — Other Ambulatory Visit (HOSPITAL_COMMUNITY): Payer: Self-pay

## 2023-08-28 MED ORDER — FLUTICASONE-SALMETEROL 100-50 MCG/ACT IN AEPB
1.0000 | INHALATION_SPRAY | Freq: Two times a day (BID) | RESPIRATORY_TRACT | 7 refills | Status: AC
  Filled 2023-08-28: qty 60, 30d supply, fill #0
  Filled 2023-10-08: qty 60, 30d supply, fill #1

## 2023-08-28 MED ORDER — LEVOCETIRIZINE DIHYDROCHLORIDE 5 MG PO TABS
5.0000 mg | ORAL_TABLET | Freq: Every evening | ORAL | 6 refills | Status: DC
  Filled 2023-08-28: qty 30, 30d supply, fill #0
  Filled 2023-09-30: qty 30, 30d supply, fill #1
  Filled 2023-10-30: qty 30, 30d supply, fill #2
  Filled 2023-11-28: qty 30, 30d supply, fill #3
  Filled 2024-01-01: qty 30, 30d supply, fill #4
  Filled 2024-02-01: qty 30, 30d supply, fill #5
  Filled 2024-02-26: qty 30, 30d supply, fill #6

## 2023-08-31 DIAGNOSIS — J3081 Allergic rhinitis due to animal (cat) (dog) hair and dander: Secondary | ICD-10-CM | POA: Diagnosis not present

## 2023-08-31 DIAGNOSIS — J3089 Other allergic rhinitis: Secondary | ICD-10-CM | POA: Diagnosis not present

## 2023-08-31 DIAGNOSIS — J301 Allergic rhinitis due to pollen: Secondary | ICD-10-CM | POA: Diagnosis not present

## 2023-09-07 DIAGNOSIS — J3089 Other allergic rhinitis: Secondary | ICD-10-CM | POA: Diagnosis not present

## 2023-09-07 DIAGNOSIS — J3081 Allergic rhinitis due to animal (cat) (dog) hair and dander: Secondary | ICD-10-CM | POA: Diagnosis not present

## 2023-09-07 DIAGNOSIS — J301 Allergic rhinitis due to pollen: Secondary | ICD-10-CM | POA: Diagnosis not present

## 2023-09-09 ENCOUNTER — Other Ambulatory Visit (HOSPITAL_COMMUNITY): Payer: Self-pay

## 2023-09-11 ENCOUNTER — Other Ambulatory Visit: Payer: Self-pay

## 2023-09-12 ENCOUNTER — Other Ambulatory Visit: Payer: Self-pay

## 2023-09-14 DIAGNOSIS — J301 Allergic rhinitis due to pollen: Secondary | ICD-10-CM | POA: Diagnosis not present

## 2023-09-14 DIAGNOSIS — J3081 Allergic rhinitis due to animal (cat) (dog) hair and dander: Secondary | ICD-10-CM | POA: Diagnosis not present

## 2023-09-14 DIAGNOSIS — J3089 Other allergic rhinitis: Secondary | ICD-10-CM | POA: Diagnosis not present

## 2023-09-17 ENCOUNTER — Other Ambulatory Visit (HOSPITAL_COMMUNITY): Payer: Self-pay

## 2023-09-18 ENCOUNTER — Other Ambulatory Visit (HOSPITAL_COMMUNITY): Payer: Self-pay

## 2023-09-18 ENCOUNTER — Other Ambulatory Visit: Payer: Self-pay

## 2023-09-18 MED ORDER — MONTELUKAST SODIUM 10 MG PO TABS
10.0000 mg | ORAL_TABLET | Freq: Every evening | ORAL | 1 refills | Status: DC
  Filled 2023-09-18: qty 90, 90d supply, fill #0
  Filled 2023-12-12: qty 90, 90d supply, fill #1

## 2023-09-22 DIAGNOSIS — J3081 Allergic rhinitis due to animal (cat) (dog) hair and dander: Secondary | ICD-10-CM | POA: Diagnosis not present

## 2023-09-22 DIAGNOSIS — J301 Allergic rhinitis due to pollen: Secondary | ICD-10-CM | POA: Diagnosis not present

## 2023-09-22 DIAGNOSIS — J3089 Other allergic rhinitis: Secondary | ICD-10-CM | POA: Diagnosis not present

## 2023-09-28 DIAGNOSIS — J3081 Allergic rhinitis due to animal (cat) (dog) hair and dander: Secondary | ICD-10-CM | POA: Diagnosis not present

## 2023-09-28 DIAGNOSIS — J301 Allergic rhinitis due to pollen: Secondary | ICD-10-CM | POA: Diagnosis not present

## 2023-09-28 DIAGNOSIS — J3089 Other allergic rhinitis: Secondary | ICD-10-CM | POA: Diagnosis not present

## 2023-09-30 ENCOUNTER — Other Ambulatory Visit (HOSPITAL_COMMUNITY): Payer: Self-pay

## 2023-10-04 ENCOUNTER — Other Ambulatory Visit (HOSPITAL_COMMUNITY): Payer: Self-pay

## 2023-10-08 ENCOUNTER — Other Ambulatory Visit (HOSPITAL_COMMUNITY): Payer: Self-pay

## 2023-10-13 DIAGNOSIS — J3089 Other allergic rhinitis: Secondary | ICD-10-CM | POA: Diagnosis not present

## 2023-10-13 DIAGNOSIS — J301 Allergic rhinitis due to pollen: Secondary | ICD-10-CM | POA: Diagnosis not present

## 2023-10-13 DIAGNOSIS — J3081 Allergic rhinitis due to animal (cat) (dog) hair and dander: Secondary | ICD-10-CM | POA: Diagnosis not present

## 2023-10-19 DIAGNOSIS — J301 Allergic rhinitis due to pollen: Secondary | ICD-10-CM | POA: Diagnosis not present

## 2023-10-19 DIAGNOSIS — J3089 Other allergic rhinitis: Secondary | ICD-10-CM | POA: Diagnosis not present

## 2023-10-19 DIAGNOSIS — J3081 Allergic rhinitis due to animal (cat) (dog) hair and dander: Secondary | ICD-10-CM | POA: Diagnosis not present

## 2023-10-24 DIAGNOSIS — J301 Allergic rhinitis due to pollen: Secondary | ICD-10-CM | POA: Diagnosis not present

## 2023-10-24 DIAGNOSIS — J3089 Other allergic rhinitis: Secondary | ICD-10-CM | POA: Diagnosis not present

## 2023-10-24 DIAGNOSIS — J3081 Allergic rhinitis due to animal (cat) (dog) hair and dander: Secondary | ICD-10-CM | POA: Diagnosis not present

## 2023-10-31 ENCOUNTER — Other Ambulatory Visit (HOSPITAL_COMMUNITY): Payer: Self-pay

## 2023-11-09 DIAGNOSIS — J3089 Other allergic rhinitis: Secondary | ICD-10-CM | POA: Diagnosis not present

## 2023-11-09 DIAGNOSIS — J301 Allergic rhinitis due to pollen: Secondary | ICD-10-CM | POA: Diagnosis not present

## 2023-11-09 DIAGNOSIS — J3081 Allergic rhinitis due to animal (cat) (dog) hair and dander: Secondary | ICD-10-CM | POA: Diagnosis not present

## 2023-11-15 ENCOUNTER — Other Ambulatory Visit (HOSPITAL_COMMUNITY): Payer: Self-pay

## 2023-11-16 ENCOUNTER — Other Ambulatory Visit (HOSPITAL_COMMUNITY): Payer: Self-pay

## 2023-11-16 MED ORDER — GLUCOSE BLOOD VI STRP
1.0000 | ORAL_STRIP | Freq: Every day | 2 refills | Status: DC
  Filled 2023-11-16 – 2023-11-22 (×2): qty 100, 100d supply, fill #0
  Filled 2024-02-26: qty 100, 100d supply, fill #1
  Filled 2024-06-09: qty 100, 100d supply, fill #2

## 2023-11-16 MED ORDER — ACCU-CHEK FASTCLIX LANCETS MISC
1.0000 | Freq: Every day | 3 refills | Status: AC
  Filled 2023-11-22: qty 102, 90d supply, fill #0
  Filled 2024-03-25: qty 102, 100d supply, fill #1
  Filled 2024-07-28: qty 102, 100d supply, fill #2

## 2023-11-22 ENCOUNTER — Other Ambulatory Visit (HOSPITAL_COMMUNITY): Payer: Self-pay

## 2023-11-23 ENCOUNTER — Other Ambulatory Visit: Payer: Self-pay

## 2023-11-23 DIAGNOSIS — J301 Allergic rhinitis due to pollen: Secondary | ICD-10-CM | POA: Diagnosis not present

## 2023-11-23 DIAGNOSIS — J3089 Other allergic rhinitis: Secondary | ICD-10-CM | POA: Diagnosis not present

## 2023-11-23 DIAGNOSIS — J3081 Allergic rhinitis due to animal (cat) (dog) hair and dander: Secondary | ICD-10-CM | POA: Diagnosis not present

## 2023-11-28 ENCOUNTER — Other Ambulatory Visit (HOSPITAL_COMMUNITY): Payer: Self-pay

## 2023-11-30 DIAGNOSIS — J301 Allergic rhinitis due to pollen: Secondary | ICD-10-CM | POA: Diagnosis not present

## 2023-11-30 DIAGNOSIS — J3089 Other allergic rhinitis: Secondary | ICD-10-CM | POA: Diagnosis not present

## 2023-11-30 DIAGNOSIS — J3081 Allergic rhinitis due to animal (cat) (dog) hair and dander: Secondary | ICD-10-CM | POA: Diagnosis not present

## 2023-12-08 DIAGNOSIS — J3081 Allergic rhinitis due to animal (cat) (dog) hair and dander: Secondary | ICD-10-CM | POA: Diagnosis not present

## 2023-12-08 DIAGNOSIS — J3089 Other allergic rhinitis: Secondary | ICD-10-CM | POA: Diagnosis not present

## 2023-12-08 DIAGNOSIS — J301 Allergic rhinitis due to pollen: Secondary | ICD-10-CM | POA: Diagnosis not present

## 2023-12-11 DIAGNOSIS — Z1231 Encounter for screening mammogram for malignant neoplasm of breast: Secondary | ICD-10-CM

## 2023-12-12 ENCOUNTER — Other Ambulatory Visit (HOSPITAL_COMMUNITY): Payer: Self-pay

## 2023-12-20 ENCOUNTER — Other Ambulatory Visit: Payer: Self-pay | Admitting: Family Medicine

## 2023-12-20 DIAGNOSIS — Z1231 Encounter for screening mammogram for malignant neoplasm of breast: Secondary | ICD-10-CM

## 2023-12-23 ENCOUNTER — Other Ambulatory Visit (HOSPITAL_COMMUNITY): Payer: Self-pay

## 2023-12-27 DIAGNOSIS — J3089 Other allergic rhinitis: Secondary | ICD-10-CM | POA: Diagnosis not present

## 2023-12-27 DIAGNOSIS — J301 Allergic rhinitis due to pollen: Secondary | ICD-10-CM | POA: Diagnosis not present

## 2023-12-27 DIAGNOSIS — J3081 Allergic rhinitis due to animal (cat) (dog) hair and dander: Secondary | ICD-10-CM | POA: Diagnosis not present

## 2024-01-01 ENCOUNTER — Other Ambulatory Visit (HOSPITAL_COMMUNITY): Payer: Self-pay

## 2024-01-15 ENCOUNTER — Ambulatory Visit
Admission: RE | Admit: 2024-01-15 | Discharge: 2024-01-15 | Disposition: A | Discharge status: Home / Self Care | Payer: Medicare Other | Source: Ambulatory Visit

## 2024-01-15 DIAGNOSIS — Z1231 Encounter for screening mammogram for malignant neoplasm of breast: Secondary | ICD-10-CM

## 2024-01-15 DIAGNOSIS — E118 Type 2 diabetes mellitus with unspecified complications: Secondary | ICD-10-CM | POA: Diagnosis not present

## 2024-01-15 DIAGNOSIS — E7800 Pure hypercholesterolemia, unspecified: Secondary | ICD-10-CM | POA: Diagnosis not present

## 2024-01-15 DIAGNOSIS — I10 Essential (primary) hypertension: Secondary | ICD-10-CM | POA: Diagnosis not present

## 2024-01-18 DIAGNOSIS — J3089 Other allergic rhinitis: Secondary | ICD-10-CM | POA: Diagnosis not present

## 2024-01-18 DIAGNOSIS — E118 Type 2 diabetes mellitus with unspecified complications: Secondary | ICD-10-CM | POA: Diagnosis not present

## 2024-01-18 DIAGNOSIS — E7800 Pure hypercholesterolemia, unspecified: Secondary | ICD-10-CM | POA: Diagnosis not present

## 2024-01-18 DIAGNOSIS — H401131 Primary open-angle glaucoma, bilateral, mild stage: Secondary | ICD-10-CM | POA: Diagnosis not present

## 2024-01-18 DIAGNOSIS — B351 Tinea unguium: Secondary | ICD-10-CM | POA: Diagnosis not present

## 2024-01-18 DIAGNOSIS — I10 Essential (primary) hypertension: Secondary | ICD-10-CM | POA: Diagnosis not present

## 2024-01-18 DIAGNOSIS — J3081 Allergic rhinitis due to animal (cat) (dog) hair and dander: Secondary | ICD-10-CM | POA: Diagnosis not present

## 2024-01-18 DIAGNOSIS — E1165 Type 2 diabetes mellitus with hyperglycemia: Secondary | ICD-10-CM | POA: Diagnosis not present

## 2024-01-18 DIAGNOSIS — Z0000 Encounter for general adult medical examination without abnormal findings: Secondary | ICD-10-CM | POA: Diagnosis not present

## 2024-01-18 DIAGNOSIS — J45909 Unspecified asthma, uncomplicated: Secondary | ICD-10-CM | POA: Diagnosis not present

## 2024-01-18 DIAGNOSIS — J301 Allergic rhinitis due to pollen: Secondary | ICD-10-CM | POA: Diagnosis not present

## 2024-01-22 ENCOUNTER — Other Ambulatory Visit (HOSPITAL_COMMUNITY): Payer: Self-pay

## 2024-01-24 ENCOUNTER — Ambulatory Visit: Admitting: Podiatry

## 2024-01-25 DIAGNOSIS — H40213 Acute angle-closure glaucoma, bilateral: Secondary | ICD-10-CM | POA: Diagnosis not present

## 2024-01-25 DIAGNOSIS — H524 Presbyopia: Secondary | ICD-10-CM | POA: Diagnosis not present

## 2024-01-31 ENCOUNTER — Ambulatory Visit: Admitting: Podiatry

## 2024-02-01 ENCOUNTER — Other Ambulatory Visit (HOSPITAL_COMMUNITY): Payer: Self-pay

## 2024-02-01 DIAGNOSIS — J3089 Other allergic rhinitis: Secondary | ICD-10-CM | POA: Diagnosis not present

## 2024-02-01 DIAGNOSIS — J301 Allergic rhinitis due to pollen: Secondary | ICD-10-CM | POA: Diagnosis not present

## 2024-02-01 DIAGNOSIS — J3081 Allergic rhinitis due to animal (cat) (dog) hair and dander: Secondary | ICD-10-CM | POA: Diagnosis not present

## 2024-02-02 ENCOUNTER — Ambulatory Visit: Admitting: Podiatry

## 2024-02-02 DIAGNOSIS — L603 Nail dystrophy: Secondary | ICD-10-CM | POA: Diagnosis not present

## 2024-02-02 NOTE — Patient Instructions (Signed)

## 2024-02-02 NOTE — Progress Notes (Signed)
 Subjective:  Patient ID: Kristina Mckinney, female    DOB: 1955-12-09,  MRN: [id number]  Chief Complaint  Patient presents with   Nail Problem    Thick discolored nails     68 y.o. female presents with the above complaint.  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   Review of Systems: Negative except as noted in the HPI. Denies N/V/F/Ch.  Past Medical History:  Diagnosis Date   Allergy    seasonal allergies   Asthma    uses inhaler   Cataract    not a surgical candidate at this time (01/29/2021)   Colon polyps    Diabetes mellitus    borderline - diet controlled, no meds   Glaucoma    on meds   Hypertension    on meds   SVD (spontaneous vaginal delivery)    x 2    Current Outpatient Medications:    Accu-Chek FastClix Lancets MISC, Use as directed to check blood sugar as directed., Disp: 100 each, Rfl: 3   albuterol (PROAIR HFA) 108 (90 Base) MCG/ACT inhaler, Inhale 1-2 puffs into the lungs every 4 (four) to 6 (six) hours as needed for cough/wheeze, Disp: 8.5 g, Rfl: 0   albuterol (PROAIR HFA) 108 (90 Base) MCG/ACT inhaler, Inhale 1-2 puffs into the lungs every 4-6 hours as needed for cough/wheeze, Disp: 6.7 g, Rfl: 0   azelastine (ASTELIN) 0.1 % nasal spray, Place 2 sprays into both nostrils 2 (two) times daily. Use in each nostril as directed, Disp: , Rfl:    Azelastine HCl 137 MCG/SPRAY SOLN, Place 1 spray into both nostrils 2 (two) times daily., Disp: 30 mL, Rfl: 6   Blood Glucose Monitoring Suppl (BLOOD GLUCOSE MONITOR SYSTEM) w/Device KIT, Use to check blood sugar, Disp: 1 kit, Rfl: 0   EPINEPHrine (EPIPEN 2-PAK) 0.3 mg/0.3 mL IJ SOAJ injection, Use as directed as needed, Disp: 2 each, Rfl: 1   EPINEPHrine (EPIPEN 2-PAK) 0.3 mg/0.3 mL IJ SOAJ injection, Use as directed as needed., Disp:  2 each, Rfl: 1   EPINEPHrine 0.3 mg/0.3 mL IJ SOAJ injection, Inject 0.3 mg into the muscle as needed. Reported on 03/25/2016 (Patient not taking: Reported on 02/12/2021), Disp: , Rfl:    fluticasone (FLONASE) 50 MCG/ACT nasal spray, Place 2 sprays into both nostrils daily., Disp: , Rfl:    fluticasone (FLONASE) 50 MCG/ACT nasal spray, Place 1-2 sprays into both nostrils daily., Disp: 16 g, Rfl: 1   fluticasone-salmeterol (ADVAIR) 100-50 MCG/ACT AEPB, Inhale 1 puff into the lungs 2 (two) times daily., Disp: 180 each, Rfl: 2   Fluticasone-Salmeterol (ADVAIR) 100-50 MCG/DOSE AEPB, INHALE 1 PUFF INTO THE LUNGS TWICE DAILY., Disp: 180 each, Rfl: 1   fluticasone-salmeterol (WIXELA INHUB) 100-50 MCG/ACT AEPB, Inhale 1 puff into the lungs 2 (two) times daily., Disp: 60 each, Rfl: 3   fluticasone-salmeterol (WIXELA INHUB) 100-50 MCG/ACT AEPB, Inhale 1 puff into the lungs 2 (two) times daily., Disp: 60 each, Rfl: 7   FREESTYLE LITE test strip, , Disp: , Rfl:    glucose blood (ACCU-CHEK GUIDE) test strip, Use to check blood sugar once daily, alternating morning and evenings before meals, Disp: 100 each, Rfl: 2   glucose blood (FREESTYLE LITE) test strip, Use as directed to check blood sugar as directed, Disp: 100 each, Rfl: 3   glucose  blood test strip, Use to test blood sugar levels once daily, alternating morning and evening before meals., Disp: 100 each, Rfl: 2   ibuprofen (ADVIL,MOTRIN) 200 MG tablet, 200 mg every 6 (six) hours as needed. (Patient not taking: Reported on 02/12/2021), Disp: , Rfl:    levocetirizine (XYZAL) 5 MG tablet, Take 1 tablet by mouth every evening., Disp: , Rfl:    levocetirizine (XYZAL) 5 MG tablet, Take 1 tablet (5 mg total) by mouth every evening., Disp: 30 tablet, Rfl: 6   lisinopril-hydrochlorothiazide (PRINZIDE,ZESTORETIC) 10-12.5 MG per tablet, Take 1 tablet by mouth daily., Disp: , Rfl:    lisinopril-hydrochlorothiazide (ZESTORETIC) 10-12.5 MG tablet, Take 1 tablet by mouth  daily., Disp: 90 tablet, Rfl: 1   lisinopril-hydrochlorothiazide (ZESTORETIC) 10-12.5 MG tablet, Take 1 tablet by mouth daily., Disp: 90 tablet, Rfl: 1   montelukast (SINGULAIR) 10 MG tablet, Take 1 tablet (10 mg total) by mouth every evening., Disp: 90 tablet, Rfl: 1   Multiple Vitamin (MULTIVITAMIN WITH MINERALS) TABS, Take 1 tablet by mouth daily. (Patient not taking: Reported on 02/12/2021), Disp: , Rfl:    Olopatadine HCl 0.2 % SOLN, as needed. (Patient not taking: Reported on 02/12/2021), Disp: , Rfl:    rosuvastatin (CRESTOR) 10 MG tablet, Take 1 tablet (10 mg total) by mouth daily., Disp: 90 tablet, Rfl: 3   rosuvastatin (CRESTOR) 10 MG tablet, Take 1 tablet (10 mg total) by mouth daily., Disp: 90 tablet, Rfl: 3  Social History   Tobacco Use  Smoking Status Former   Current packs/day: 0.00   Types: Cigarettes   Quit date: 01/04/1980   Years since quitting: 44.1  Smokeless Tobacco Never    Allergies  Allergen Reactions   Penicillins     REACTION: Rash/lips swell   Tetracyclines & Related Rash   Objective:  There were no vitals filed for this visit. There is no height or weight on file to calculate BMI. Constitutional Well developed. Well nourished.  Vascular Dorsalis pedis pulses palpable bilaterally. Posterior tibial pulses palpable bilaterally. Capillary refill normal to all digits.  No cyanosis or clubbing noted. Pedal hair growth normal.  Neurologic Normal speech. Oriented to person, place, and time. Epicritic sensation to light touch grossly present bilaterally.  Dermatologic Pain on palpation of the entire/total nail on right digit of the right No other open wounds. No skin lesions.  Orthopedic: Normal joint ROM without pain or crepitus bilaterally. No visible deformities. No bony tenderness.   Radiographs: None Assessment:  No diagnosis found. Plan:  Patient was evaluated and treated and all questions answered.  Nail contusion/dystrophy hallux,  right -Patient elects to proceed with minor surgery to remove entire toenail today. Consent reviewed and signed by patient. -Entire/total nail excised. See procedure note. -Educated on post-procedure care including soaking. Written instructions provided and reviewed. -Patient to follow up in 2 weeks for nail check.  Procedure: Excision of entire/total nail  Location: Right 1st toe digit Anesthesia: Lidocaine 1% plain; 1.5 mL and Marcaine 0.5% plain; 1.5 mL, digital block. Skin Prep: Betadine. Dressing: Silvadene; telfa; dry, sterile, compression dressing. Technique: Following skin prep, the toe was exsanguinated and a tourniquet was secured at the base of the toe. The affected nail border was freed and excised. The tourniquet was then removed and sterile dressing applied. Disposition: Patient tolerated procedure well. Patient to return in 2 weeks for follow-up.   No follow-ups on file.

## 2024-02-06 ENCOUNTER — Encounter: Payer: Self-pay | Admitting: Podiatry

## 2024-02-06 ENCOUNTER — Ambulatory Visit: Admitting: Podiatry

## 2024-02-06 DIAGNOSIS — L603 Nail dystrophy: Secondary | ICD-10-CM

## 2024-02-06 NOTE — Progress Notes (Signed)
  Subjective:  Patient ID: Kristina Mckinney, female    DOB: 1956-10-10,  MRN: [id number]  Chief Complaint  Patient presents with   Nail Problem    Patient states that she came in and got her right Hallux removed and the gaze is stuck to her right Hallux. It is very sore to the touch. Patient states she has not been taking medication for pain, patient states that it may be infected       67 y.o. female returns for post-op check.  History noted above confirmed with patient  Review of Systems: Negative except as noted in the HPI. Denies N/V/F/Ch.   Objective:  There were no vitals filed for this visit. There is no height or weight on file to calculate BMI. Constitutional Well developed. Well nourished.  Vascular Foot warm and well perfused. Capillary refill normal to all digits.  Calf is soft and supple, no posterior calf or knee pain, negative Homans' sign  Neurologic Normal speech. Oriented to person, place, and time. Epicritic sensation to light touch grossly present bilaterally.  Dermatologic Gauze stuck to matricectomy site  Orthopedic: Tenderness to palpation noted about the surgical site.    Assessment:  No diagnosis found. Plan:  Patient was evaluated and treated and all questions answered.  S/p foot surgery right -Gauze was stuck to the matricectomy site and difficulty get off.  We attempted to saturated with 3 WEA solution which was unsuccessful.  I then performed a digital block with 3 cc of lidocaine and remove the dressing.  A nonadherent with antibiotic ointment was applied.  Post care instructions given she should continue soaking for the next week with nonadherent gauze and Neosporin.  Follow-up with [place] as needed if further issues.  No follow-ups on file.

## 2024-02-08 DIAGNOSIS — J3081 Allergic rhinitis due to animal (cat) (dog) hair and dander: Secondary | ICD-10-CM | POA: Diagnosis not present

## 2024-02-08 DIAGNOSIS — J301 Allergic rhinitis due to pollen: Secondary | ICD-10-CM | POA: Diagnosis not present

## 2024-02-08 DIAGNOSIS — J3089 Other allergic rhinitis: Secondary | ICD-10-CM | POA: Diagnosis not present

## 2024-02-15 ENCOUNTER — Other Ambulatory Visit (HOSPITAL_COMMUNITY): Payer: Self-pay

## 2024-02-16 ENCOUNTER — Other Ambulatory Visit (HOSPITAL_COMMUNITY): Payer: Self-pay

## 2024-02-16 MED ORDER — LISINOPRIL-HYDROCHLOROTHIAZIDE 10-12.5 MG PO TABS
1.0000 | ORAL_TABLET | Freq: Every day | ORAL | 1 refills | Status: DC
  Filled 2024-02-16: qty 90, 90d supply, fill #0
  Filled 2024-05-15: qty 90, 90d supply, fill #1

## 2024-02-26 ENCOUNTER — Other Ambulatory Visit (HOSPITAL_BASED_OUTPATIENT_CLINIC_OR_DEPARTMENT_OTHER): Payer: Self-pay

## 2024-02-26 ENCOUNTER — Other Ambulatory Visit (HOSPITAL_COMMUNITY): Payer: Self-pay

## 2024-02-28 DIAGNOSIS — J3081 Allergic rhinitis due to animal (cat) (dog) hair and dander: Secondary | ICD-10-CM | POA: Diagnosis not present

## 2024-02-28 DIAGNOSIS — J3089 Other allergic rhinitis: Secondary | ICD-10-CM | POA: Diagnosis not present

## 2024-02-28 DIAGNOSIS — J301 Allergic rhinitis due to pollen: Secondary | ICD-10-CM | POA: Diagnosis not present

## 2024-03-06 DIAGNOSIS — J301 Allergic rhinitis due to pollen: Secondary | ICD-10-CM | POA: Diagnosis not present

## 2024-03-06 DIAGNOSIS — J3081 Allergic rhinitis due to animal (cat) (dog) hair and dander: Secondary | ICD-10-CM | POA: Diagnosis not present

## 2024-03-06 DIAGNOSIS — J3089 Other allergic rhinitis: Secondary | ICD-10-CM | POA: Diagnosis not present

## 2024-03-23 ENCOUNTER — Other Ambulatory Visit (HOSPITAL_COMMUNITY): Payer: Self-pay

## 2024-03-25 ENCOUNTER — Other Ambulatory Visit (HOSPITAL_COMMUNITY): Payer: Self-pay

## 2024-03-25 ENCOUNTER — Other Ambulatory Visit: Payer: Self-pay

## 2024-03-25 MED ORDER — LEVOCETIRIZINE DIHYDROCHLORIDE 5 MG PO TABS
5.0000 mg | ORAL_TABLET | Freq: Every evening | ORAL | 3 refills | Status: DC
  Filled 2024-03-25: qty 30, 30d supply, fill #0
  Filled 2024-04-28: qty 30, 30d supply, fill #1
  Filled 2024-05-26: qty 30, 30d supply, fill #2
  Filled 2024-06-26: qty 30, 30d supply, fill #3

## 2024-03-25 MED ORDER — MONTELUKAST SODIUM 10 MG PO TABS
10.0000 mg | ORAL_TABLET | Freq: Every evening | ORAL | 0 refills | Status: DC
  Filled 2024-03-25: qty 90, 90d supply, fill #0

## 2024-03-26 ENCOUNTER — Other Ambulatory Visit: Payer: Self-pay

## 2024-03-26 ENCOUNTER — Other Ambulatory Visit (HOSPITAL_COMMUNITY): Payer: Self-pay

## 2024-03-29 DIAGNOSIS — J301 Allergic rhinitis due to pollen: Secondary | ICD-10-CM | POA: Diagnosis not present

## 2024-03-29 DIAGNOSIS — J3081 Allergic rhinitis due to animal (cat) (dog) hair and dander: Secondary | ICD-10-CM | POA: Diagnosis not present

## 2024-03-29 DIAGNOSIS — J3089 Other allergic rhinitis: Secondary | ICD-10-CM | POA: Diagnosis not present

## 2024-04-05 DIAGNOSIS — J301 Allergic rhinitis due to pollen: Secondary | ICD-10-CM | POA: Diagnosis not present

## 2024-04-05 DIAGNOSIS — J3081 Allergic rhinitis due to animal (cat) (dog) hair and dander: Secondary | ICD-10-CM | POA: Diagnosis not present

## 2024-04-05 DIAGNOSIS — J3089 Other allergic rhinitis: Secondary | ICD-10-CM | POA: Diagnosis not present

## 2024-04-10 DIAGNOSIS — M1712 Unilateral primary osteoarthritis, left knee: Secondary | ICD-10-CM | POA: Diagnosis not present

## 2024-04-12 DIAGNOSIS — J301 Allergic rhinitis due to pollen: Secondary | ICD-10-CM | POA: Diagnosis not present

## 2024-04-12 DIAGNOSIS — J3081 Allergic rhinitis due to animal (cat) (dog) hair and dander: Secondary | ICD-10-CM | POA: Diagnosis not present

## 2024-04-12 DIAGNOSIS — J3089 Other allergic rhinitis: Secondary | ICD-10-CM | POA: Diagnosis not present

## 2024-04-22 DIAGNOSIS — J3089 Other allergic rhinitis: Secondary | ICD-10-CM | POA: Diagnosis not present

## 2024-04-22 DIAGNOSIS — J301 Allergic rhinitis due to pollen: Secondary | ICD-10-CM | POA: Diagnosis not present

## 2024-04-22 DIAGNOSIS — J3081 Allergic rhinitis due to animal (cat) (dog) hair and dander: Secondary | ICD-10-CM | POA: Diagnosis not present

## 2024-04-25 ENCOUNTER — Other Ambulatory Visit (HOSPITAL_COMMUNITY): Payer: Self-pay

## 2024-04-25 DIAGNOSIS — H1012 Acute atopic conjunctivitis, left eye: Secondary | ICD-10-CM | POA: Diagnosis not present

## 2024-04-27 ENCOUNTER — Other Ambulatory Visit (HOSPITAL_COMMUNITY): Payer: Self-pay

## 2024-04-27 MED ORDER — TOBRAMYCIN-DEXAMETHASONE 0.3-0.1 % OP SUSP
1.0000 [drp] | Freq: Three times a day (TID) | OPHTHALMIC | 0 refills | Status: DC
  Filled 2024-04-27: qty 5, 34d supply, fill #0

## 2024-04-29 ENCOUNTER — Other Ambulatory Visit (HOSPITAL_COMMUNITY): Payer: Self-pay

## 2024-04-29 ENCOUNTER — Other Ambulatory Visit: Payer: Self-pay

## 2024-04-29 MED ORDER — TOBRAMYCIN-DEXAMETHASONE 0.3-0.1 % OP SUSP
1.0000 [drp] | Freq: Three times a day (TID) | OPHTHALMIC | 0 refills | Status: AC
  Filled 2024-04-29: qty 5, 34d supply, fill #0

## 2024-05-07 DIAGNOSIS — J301 Allergic rhinitis due to pollen: Secondary | ICD-10-CM | POA: Diagnosis not present

## 2024-05-07 DIAGNOSIS — J3081 Allergic rhinitis due to animal (cat) (dog) hair and dander: Secondary | ICD-10-CM | POA: Diagnosis not present

## 2024-05-07 DIAGNOSIS — J3089 Other allergic rhinitis: Secondary | ICD-10-CM | POA: Diagnosis not present

## 2024-05-16 ENCOUNTER — Other Ambulatory Visit (HOSPITAL_COMMUNITY): Payer: Self-pay

## 2024-05-23 DIAGNOSIS — J3089 Other allergic rhinitis: Secondary | ICD-10-CM | POA: Diagnosis not present

## 2024-05-23 DIAGNOSIS — J3081 Allergic rhinitis due to animal (cat) (dog) hair and dander: Secondary | ICD-10-CM | POA: Diagnosis not present

## 2024-05-23 DIAGNOSIS — J301 Allergic rhinitis due to pollen: Secondary | ICD-10-CM | POA: Diagnosis not present

## 2024-05-27 ENCOUNTER — Other Ambulatory Visit (HOSPITAL_COMMUNITY): Payer: Self-pay

## 2024-06-03 DIAGNOSIS — J3089 Other allergic rhinitis: Secondary | ICD-10-CM | POA: Diagnosis not present

## 2024-06-03 DIAGNOSIS — J3081 Allergic rhinitis due to animal (cat) (dog) hair and dander: Secondary | ICD-10-CM | POA: Diagnosis not present

## 2024-06-03 DIAGNOSIS — J301 Allergic rhinitis due to pollen: Secondary | ICD-10-CM | POA: Diagnosis not present

## 2024-06-06 DIAGNOSIS — E7800 Pure hypercholesterolemia, unspecified: Secondary | ICD-10-CM | POA: Diagnosis not present

## 2024-06-06 DIAGNOSIS — E1165 Type 2 diabetes mellitus with hyperglycemia: Secondary | ICD-10-CM | POA: Diagnosis not present

## 2024-06-06 DIAGNOSIS — E118 Type 2 diabetes mellitus with unspecified complications: Secondary | ICD-10-CM | POA: Diagnosis not present

## 2024-06-06 DIAGNOSIS — H401131 Primary open-angle glaucoma, bilateral, mild stage: Secondary | ICD-10-CM | POA: Diagnosis not present

## 2024-06-10 ENCOUNTER — Other Ambulatory Visit (HOSPITAL_COMMUNITY): Payer: Self-pay

## 2024-06-14 ENCOUNTER — Other Ambulatory Visit (HOSPITAL_COMMUNITY): Payer: Self-pay

## 2024-06-14 DIAGNOSIS — J4530 Mild persistent asthma, uncomplicated: Secondary | ICD-10-CM | POA: Diagnosis not present

## 2024-06-14 DIAGNOSIS — J3081 Allergic rhinitis due to animal (cat) (dog) hair and dander: Secondary | ICD-10-CM | POA: Diagnosis not present

## 2024-06-14 DIAGNOSIS — J3089 Other allergic rhinitis: Secondary | ICD-10-CM | POA: Diagnosis not present

## 2024-06-14 DIAGNOSIS — J301 Allergic rhinitis due to pollen: Secondary | ICD-10-CM | POA: Diagnosis not present

## 2024-06-14 MED ORDER — ALBUTEROL SULFATE HFA 108 (90 BASE) MCG/ACT IN AERS
1.0000 | INHALATION_SPRAY | RESPIRATORY_TRACT | 0 refills | Status: AC | PRN
  Filled 2024-06-14: qty 6.7, 17d supply, fill #0

## 2024-06-20 DIAGNOSIS — J3089 Other allergic rhinitis: Secondary | ICD-10-CM | POA: Diagnosis not present

## 2024-06-20 DIAGNOSIS — J3081 Allergic rhinitis due to animal (cat) (dog) hair and dander: Secondary | ICD-10-CM | POA: Diagnosis not present

## 2024-06-20 DIAGNOSIS — J301 Allergic rhinitis due to pollen: Secondary | ICD-10-CM | POA: Diagnosis not present

## 2024-06-25 ENCOUNTER — Other Ambulatory Visit (HOSPITAL_COMMUNITY): Payer: Self-pay

## 2024-06-26 ENCOUNTER — Other Ambulatory Visit: Payer: Self-pay

## 2024-06-26 ENCOUNTER — Other Ambulatory Visit (HOSPITAL_COMMUNITY): Payer: Self-pay

## 2024-06-26 MED ORDER — MONTELUKAST SODIUM 10 MG PO TABS
10.0000 mg | ORAL_TABLET | Freq: Every evening | ORAL | 3 refills | Status: AC
  Filled 2024-06-26: qty 90, 90d supply, fill #0
  Filled 2024-09-21: qty 90, 90d supply, fill #1

## 2024-06-26 MED ORDER — ROSUVASTATIN CALCIUM 10 MG PO TABS
10.0000 mg | ORAL_TABLET | Freq: Every day | ORAL | 0 refills | Status: DC
  Filled 2024-06-26: qty 90, 90d supply, fill #0

## 2024-07-02 DIAGNOSIS — J301 Allergic rhinitis due to pollen: Secondary | ICD-10-CM | POA: Diagnosis not present

## 2024-07-02 DIAGNOSIS — J3081 Allergic rhinitis due to animal (cat) (dog) hair and dander: Secondary | ICD-10-CM | POA: Diagnosis not present

## 2024-07-02 DIAGNOSIS — J3089 Other allergic rhinitis: Secondary | ICD-10-CM | POA: Diagnosis not present

## 2024-07-07 DIAGNOSIS — E1165 Type 2 diabetes mellitus with hyperglycemia: Secondary | ICD-10-CM | POA: Diagnosis not present

## 2024-07-07 DIAGNOSIS — E7800 Pure hypercholesterolemia, unspecified: Secondary | ICD-10-CM | POA: Diagnosis not present

## 2024-07-07 DIAGNOSIS — H401131 Primary open-angle glaucoma, bilateral, mild stage: Secondary | ICD-10-CM | POA: Diagnosis not present

## 2024-07-07 DIAGNOSIS — E118 Type 2 diabetes mellitus with unspecified complications: Secondary | ICD-10-CM | POA: Diagnosis not present

## 2024-07-18 DIAGNOSIS — J3081 Allergic rhinitis due to animal (cat) (dog) hair and dander: Secondary | ICD-10-CM | POA: Diagnosis not present

## 2024-07-18 DIAGNOSIS — J301 Allergic rhinitis due to pollen: Secondary | ICD-10-CM | POA: Diagnosis not present

## 2024-07-18 DIAGNOSIS — J3089 Other allergic rhinitis: Secondary | ICD-10-CM | POA: Diagnosis not present

## 2024-07-19 DIAGNOSIS — E7800 Pure hypercholesterolemia, unspecified: Secondary | ICD-10-CM | POA: Diagnosis not present

## 2024-07-19 DIAGNOSIS — E118 Type 2 diabetes mellitus with unspecified complications: Secondary | ICD-10-CM | POA: Diagnosis not present

## 2024-07-19 DIAGNOSIS — I10 Essential (primary) hypertension: Secondary | ICD-10-CM | POA: Diagnosis not present

## 2024-07-23 DIAGNOSIS — J301 Allergic rhinitis due to pollen: Secondary | ICD-10-CM | POA: Diagnosis not present

## 2024-07-23 DIAGNOSIS — E118 Type 2 diabetes mellitus with unspecified complications: Secondary | ICD-10-CM | POA: Diagnosis not present

## 2024-07-23 DIAGNOSIS — J3081 Allergic rhinitis due to animal (cat) (dog) hair and dander: Secondary | ICD-10-CM | POA: Diagnosis not present

## 2024-07-23 DIAGNOSIS — J45909 Unspecified asthma, uncomplicated: Secondary | ICD-10-CM | POA: Diagnosis not present

## 2024-07-23 DIAGNOSIS — E7800 Pure hypercholesterolemia, unspecified: Secondary | ICD-10-CM | POA: Diagnosis not present

## 2024-07-23 DIAGNOSIS — J3089 Other allergic rhinitis: Secondary | ICD-10-CM | POA: Diagnosis not present

## 2024-07-23 DIAGNOSIS — I10 Essential (primary) hypertension: Secondary | ICD-10-CM | POA: Diagnosis not present

## 2024-07-28 ENCOUNTER — Other Ambulatory Visit (HOSPITAL_COMMUNITY): Payer: Self-pay

## 2024-07-28 MED ORDER — AZELASTINE HCL 137 MCG/SPRAY NA SOLN
1.0000 | Freq: Two times a day (BID) | NASAL | 5 refills | Status: AC
  Filled 2024-07-28: qty 30, 30d supply, fill #0
  Filled 2024-10-24: qty 30, 50d supply, fill #1

## 2024-07-28 MED ORDER — LEVOCETIRIZINE DIHYDROCHLORIDE 5 MG PO TABS
5.0000 mg | ORAL_TABLET | Freq: Every evening | ORAL | 5 refills | Status: AC
  Filled 2024-07-28: qty 30, 30d supply, fill #0
  Filled 2024-08-26: qty 30, 30d supply, fill #1
  Filled 2024-09-21: qty 30, 30d supply, fill #2
  Filled 2024-10-24: qty 30, 30d supply, fill #3
  Filled 2024-11-27: qty 30, 30d supply, fill #4

## 2024-07-29 ENCOUNTER — Other Ambulatory Visit: Payer: Self-pay

## 2024-07-29 ENCOUNTER — Other Ambulatory Visit (HOSPITAL_COMMUNITY): Payer: Self-pay

## 2024-07-29 MED ORDER — LISINOPRIL-HYDROCHLOROTHIAZIDE 10-12.5 MG PO TABS
1.0000 | ORAL_TABLET | Freq: Every day | ORAL | 1 refills | Status: AC
  Filled 2024-07-29: qty 90, 90d supply, fill #0
  Filled 2024-11-13: qty 90, 90d supply, fill #1

## 2024-07-29 MED ORDER — ROSUVASTATIN CALCIUM 10 MG PO TABS
10.0000 mg | ORAL_TABLET | Freq: Every day | ORAL | 1 refills | Status: AC
  Filled 2024-07-29 – 2024-09-21 (×2): qty 90, 90d supply, fill #0

## 2024-07-30 ENCOUNTER — Other Ambulatory Visit (HOSPITAL_COMMUNITY): Payer: Self-pay

## 2024-07-31 ENCOUNTER — Other Ambulatory Visit: Payer: Self-pay

## 2024-07-31 ENCOUNTER — Other Ambulatory Visit (HOSPITAL_COMMUNITY): Payer: Self-pay

## 2024-07-31 DIAGNOSIS — J3081 Allergic rhinitis due to animal (cat) (dog) hair and dander: Secondary | ICD-10-CM | POA: Diagnosis not present

## 2024-07-31 DIAGNOSIS — J301 Allergic rhinitis due to pollen: Secondary | ICD-10-CM | POA: Diagnosis not present

## 2024-07-31 DIAGNOSIS — J3089 Other allergic rhinitis: Secondary | ICD-10-CM | POA: Diagnosis not present

## 2024-08-14 DIAGNOSIS — J3089 Other allergic rhinitis: Secondary | ICD-10-CM | POA: Diagnosis not present

## 2024-08-14 DIAGNOSIS — J3081 Allergic rhinitis due to animal (cat) (dog) hair and dander: Secondary | ICD-10-CM | POA: Diagnosis not present

## 2024-08-14 DIAGNOSIS — J301 Allergic rhinitis due to pollen: Secondary | ICD-10-CM | POA: Diagnosis not present

## 2024-08-21 DIAGNOSIS — J3081 Allergic rhinitis due to animal (cat) (dog) hair and dander: Secondary | ICD-10-CM | POA: Diagnosis not present

## 2024-08-21 DIAGNOSIS — J301 Allergic rhinitis due to pollen: Secondary | ICD-10-CM | POA: Diagnosis not present

## 2024-08-21 DIAGNOSIS — J3089 Other allergic rhinitis: Secondary | ICD-10-CM | POA: Diagnosis not present

## 2024-08-26 ENCOUNTER — Other Ambulatory Visit (HOSPITAL_COMMUNITY): Payer: Self-pay

## 2024-08-28 ENCOUNTER — Other Ambulatory Visit: Payer: Self-pay

## 2024-08-28 ENCOUNTER — Other Ambulatory Visit (HOSPITAL_COMMUNITY): Payer: Self-pay

## 2024-08-28 DIAGNOSIS — J301 Allergic rhinitis due to pollen: Secondary | ICD-10-CM | POA: Diagnosis not present

## 2024-08-28 DIAGNOSIS — J3089 Other allergic rhinitis: Secondary | ICD-10-CM | POA: Diagnosis not present

## 2024-08-28 DIAGNOSIS — J3081 Allergic rhinitis due to animal (cat) (dog) hair and dander: Secondary | ICD-10-CM | POA: Diagnosis not present

## 2024-08-28 MED ORDER — EPINEPHRINE 0.3 MG/0.3ML IJ SOAJ
INTRAMUSCULAR | 1 refills | Status: AC
  Filled 2024-08-28: qty 2, 30d supply, fill #0

## 2024-08-29 ENCOUNTER — Other Ambulatory Visit (HOSPITAL_COMMUNITY): Payer: Self-pay

## 2024-09-22 ENCOUNTER — Other Ambulatory Visit (HOSPITAL_COMMUNITY): Payer: Self-pay

## 2024-09-23 ENCOUNTER — Other Ambulatory Visit: Payer: Self-pay

## 2024-10-11 ENCOUNTER — Other Ambulatory Visit: Payer: Self-pay

## 2024-10-11 ENCOUNTER — Other Ambulatory Visit (HOSPITAL_COMMUNITY): Payer: Self-pay

## 2024-10-11 MED ORDER — ACCU-CHEK GUIDE TEST VI STRP
ORAL_STRIP | 2 refills | Status: AC
  Filled 2024-10-11: qty 100, 90d supply, fill #0

## 2024-10-24 ENCOUNTER — Other Ambulatory Visit: Payer: Self-pay

## 2024-11-13 ENCOUNTER — Other Ambulatory Visit: Payer: Self-pay

## 2024-11-28 ENCOUNTER — Other Ambulatory Visit: Payer: Self-pay

## 2025-01-17 ENCOUNTER — Encounter

## 2025-01-17 DIAGNOSIS — Z1231 Encounter for screening mammogram for malignant neoplasm of breast: Secondary | ICD-10-CM
